# Patient Record
Sex: Female | Born: 1985 | Race: White | Hispanic: No | Smoking: Current every day smoker
Health system: Southern US, Community
[De-identification: ages and names within clinical notes are randomized; demographics above are authoritative.]

## PROBLEM LIST (undated history)

## (undated) ENCOUNTER — Inpatient Hospital Stay: Payer: Self-pay

## (undated) DIAGNOSIS — Q270 Congenital absence and hypoplasia of umbilical artery: Secondary | ICD-10-CM

## (undated) DIAGNOSIS — F32A Depression, unspecified: Secondary | ICD-10-CM

## (undated) DIAGNOSIS — O039 Complete or unspecified spontaneous abortion without complication: Secondary | ICD-10-CM

## (undated) DIAGNOSIS — R1900 Intra-abdominal and pelvic swelling, mass and lump, unspecified site: Secondary | ICD-10-CM

## (undated) DIAGNOSIS — K8020 Calculus of gallbladder without cholecystitis without obstruction: Secondary | ICD-10-CM

## (undated) DIAGNOSIS — O368190 Decreased fetal movements, unspecified trimester, not applicable or unspecified: Secondary | ICD-10-CM

## (undated) DIAGNOSIS — F419 Anxiety disorder, unspecified: Secondary | ICD-10-CM

## (undated) DIAGNOSIS — Z87898 Personal history of other specified conditions: Secondary | ICD-10-CM

## (undated) DIAGNOSIS — F329 Major depressive disorder, single episode, unspecified: Secondary | ICD-10-CM

## (undated) DIAGNOSIS — I499 Cardiac arrhythmia, unspecified: Secondary | ICD-10-CM

## (undated) DIAGNOSIS — R011 Cardiac murmur, unspecified: Secondary | ICD-10-CM

## (undated) HISTORY — DX: Congenital absence and hypoplasia of umbilical artery: Q27.0

## (undated) HISTORY — DX: Calculus of gallbladder without cholecystitis without obstruction: K80.20

## (undated) HISTORY — DX: Personal history of other specified conditions: Z87.898

## (undated) HISTORY — PX: COLONOSCOPY: SHX174

## (undated) HISTORY — DX: Decreased fetal movements, unspecified trimester, not applicable or unspecified: O36.8190

## (undated) HISTORY — PX: WISDOM TOOTH EXTRACTION: SHX21

---
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
## 2008-05-25 ENCOUNTER — Emergency Department: Payer: Self-pay | Admitting: Emergency Medicine

## 2008-12-20 ENCOUNTER — Observation Stay: Payer: Self-pay | Admitting: Unknown Physician Specialty

## 2008-12-23 ENCOUNTER — Inpatient Hospital Stay: Payer: Self-pay | Admitting: Obstetrics and Gynecology

## 2013-03-06 ENCOUNTER — Emergency Department: Payer: Self-pay | Admitting: Unknown Physician Specialty

## 2013-03-06 LAB — COMPREHENSIVE METABOLIC PANEL
Albumin: 4.4 g/dL (ref 3.4–5.0)
Anion Gap: 7 (ref 7–16)
BUN: 12 mg/dL (ref 7–18)
Bilirubin,Total: 0.8 mg/dL (ref 0.2–1.0)
Chloride: 106 mmol/L (ref 98–107)
Creatinine: 0.78 mg/dL (ref 0.60–1.30)
EGFR (African American): >60
EGFR (Non-African Amer.): >60
SGOT(AST): 38 U/L — ABNORMAL HIGH (ref 15–37)
SGPT (ALT): 44 U/L (ref 12–78)
Total Protein: 8.3 g/dL — ABNORMAL HIGH (ref 6.4–8.2)

## 2013-03-06 LAB — URINALYSIS, COMPLETE
Bilirubin,UR: NEGATIVE
Blood: NEGATIVE
Glucose,UR: NEGATIVE mg/dL (ref 0–75)
Ph: 5 (ref 4.5–8.0)
RBC,UR: 7 /HPF (ref 0–5)

## 2013-03-06 LAB — CBC
HCT: 46 % (ref 35.0–47.0)
HGB: 15.8 g/dL (ref 12.0–16.0)
MCH: 30.9 pg (ref 26.0–34.0)
MCV: 90 fL (ref 80–100)
RBC: 5.13 10*6/uL (ref 3.80–5.20)
RDW: 12.3 % (ref 11.5–14.5)
WBC: 5 10*3/uL (ref 3.6–11.0)

## 2013-03-08 ENCOUNTER — Emergency Department: Payer: Self-pay | Admitting: Emergency Medicine

## 2013-03-08 LAB — URINALYSIS, COMPLETE
Bilirubin,UR: NEGATIVE
Glucose,UR: NEGATIVE mg/dL (ref 0–75)
Nitrite: NEGATIVE
Ph: 5 (ref 4.5–8.0)
Protein: 30
RBC,UR: 20 /HPF (ref 0–5)
Specific Gravity: 1.029 (ref 1.003–1.030)
Squamous Epithelial: 19

## 2013-06-11 ENCOUNTER — Emergency Department: Payer: Self-pay | Admitting: Emergency Medicine

## 2013-06-11 LAB — BASIC METABOLIC PANEL
BUN: 8 mg/dL (ref 7–18)
Chloride: 107 mmol/L (ref 98–107)
Co2: 28 mmol/L (ref 21–32)
Creatinine: 0.76 mg/dL (ref 0.60–1.30)
EGFR (African American): >60
EGFR (Non-African Amer.): >60
Glucose: 90 mg/dL (ref 65–99)
Osmolality: 279 (ref 275–301)
Potassium: 3.6 mmol/L (ref 3.5–5.1)

## 2013-06-11 LAB — CBC
HCT: 41.8 % (ref 35.0–47.0)
HGB: 14.6 g/dL (ref 12.0–16.0)
MCV: 89 fL (ref 80–100)
Platelet: 214 10*3/uL (ref 150–440)
RBC: 4.72 10*6/uL (ref 3.80–5.20)

## 2013-06-11 LAB — URINALYSIS, COMPLETE
Glucose,UR: NEGATIVE mg/dL (ref 0–75)
Ketone: NEGATIVE
Nitrite: NEGATIVE
Ph: 6 (ref 4.5–8.0)
RBC,UR: 2 /HPF (ref 0–5)
Squamous Epithelial: 8
WBC UR: 30 /HPF (ref 0–5)

## 2013-06-11 LAB — WET PREP, GENITAL

## 2013-06-11 LAB — GC/CHLAMYDIA PROBE AMP

## 2015-07-20 ENCOUNTER — Emergency Department
Admission: EM | Admit: 2015-07-20 | Discharge: 2015-07-20 | Disposition: A | Discharge status: Home / Self Care | Payer: Medicaid Other | Attending: Emergency Medicine | Admitting: Emergency Medicine

## 2015-07-20 ENCOUNTER — Emergency Department: Payer: Medicaid Other

## 2015-07-20 ENCOUNTER — Encounter: Payer: Self-pay | Admitting: *Deleted

## 2015-07-20 DIAGNOSIS — Z3A01 Less than 8 weeks gestation of pregnancy: Secondary | ICD-10-CM | POA: Diagnosis not present

## 2015-07-20 DIAGNOSIS — O200 Threatened abortion: Secondary | ICD-10-CM | POA: Insufficient documentation

## 2015-07-20 DIAGNOSIS — O209 Hemorrhage in early pregnancy, unspecified: Secondary | ICD-10-CM | POA: Diagnosis present

## 2015-07-20 DIAGNOSIS — Z87891 Personal history of nicotine dependence: Secondary | ICD-10-CM | POA: Insufficient documentation

## 2015-07-20 HISTORY — DX: Complete or unspecified spontaneous abortion without complication: O03.9

## 2015-07-20 LAB — URINALYSIS COMPLETE WITH MICROSCOPIC (ARMC ONLY)
BILIRUBIN URINE: NEGATIVE
GLUCOSE, UA: NEGATIVE mg/dL
Ketones, ur: NEGATIVE mg/dL
Leukocytes, UA: NEGATIVE
Nitrite: NEGATIVE
Protein, ur: NEGATIVE mg/dL
SPECIFIC GRAVITY, URINE: 1.014 (ref 1.005–1.030)
pH: 7 (ref 5.0–8.0)

## 2015-07-20 LAB — COMPREHENSIVE METABOLIC PANEL
ALBUMIN: 4.1 g/dL (ref 3.5–5.0)
ALT: 23 U/L (ref 14–54)
AST: 23 U/L (ref 15–41)
Alkaline Phosphatase: 45 U/L (ref 38–126)
Anion gap: 10 (ref 5–15)
BILIRUBIN TOTAL: 0.4 mg/dL (ref 0.3–1.2)
BUN: 13 mg/dL (ref 6–20)
CHLORIDE: 103 mmol/L (ref 101–111)
CO2: 25 mmol/L (ref 22–32)
CREATININE: 0.59 mg/dL (ref 0.44–1.00)
Calcium: 9.1 mg/dL (ref 8.9–10.3)
GFR calc Af Amer: >60 mL/min (ref >60)
GFR calc non Af Amer: >60 mL/min (ref >60)
Glucose, Bld: 77 mg/dL (ref 65–99)
Potassium: 3.6 mmol/L (ref 3.5–5.1)
Sodium: 138 mmol/L (ref 135–145)
TOTAL PROTEIN: 6.8 g/dL (ref 6.5–8.1)

## 2015-07-20 LAB — CBC WITH DIFFERENTIAL/PLATELET
BASOS PCT: 0 %
Basophils Absolute: 0 10*3/uL (ref 0–0.1)
Eosinophils Absolute: 0.2 10*3/uL (ref 0–0.7)
Eosinophils Relative: 3 %
HCT: 39.6 % (ref 35.0–47.0)
Hemoglobin: 13.6 g/dL (ref 12.0–16.0)
LYMPHS ABS: 2.3 10*3/uL (ref 1.0–3.6)
Lymphocytes Relative: 28 %
MCH: 30.7 pg (ref 26.0–34.0)
MCHC: 34.4 g/dL (ref 32.0–36.0)
MCV: 89.3 fL (ref 80.0–100.0)
MONO ABS: 0.6 10*3/uL (ref 0.2–0.9)
Monocytes Relative: 7 %
Neutro Abs: 5.2 10*3/uL (ref 1.4–6.5)
Neutrophils Relative %: 62 %
Platelets: 213 10*3/uL (ref 150–440)
RBC: 4.43 MIL/uL (ref 3.80–5.20)
RDW: 12.4 % (ref 11.5–14.5)
WBC: 8.4 10*3/uL (ref 3.6–11.0)

## 2015-07-20 LAB — HCG, QUANTITATIVE, PREGNANCY: HCG, BETA CHAIN, QUANT, S: 77897 m[IU]/mL — AB (ref <5)

## 2015-07-20 LAB — ANTIBODY SCREEN: ANTIBODY SCREEN: NEGATIVE

## 2015-07-20 LAB — ABO/RH: ABO/RH(D): O NEG

## 2015-07-20 MED ORDER — RHO D IMMUNE GLOBULIN 1500 UNIT/2ML IJ SOSY
300.0000 ug | PREFILLED_SYRINGE | Freq: Once | INTRAMUSCULAR | Status: AC
Start: 2015-07-20 — End: 2015-07-20
  Administered 2015-07-20: 300 ug via INTRAMUSCULAR
  Filled 2015-07-20: qty 2

## 2015-07-20 NOTE — ED Provider Notes (Signed)
[facility] Emergency Department Provider Note     Time seen: ----------------------------------------- 7:29 AM on 07/20/2015 -----------------------------------------    I have reviewed the triage vital signs and the nursing notes.   HISTORY  Chief Complaint Vaginal Bleeding    HPI Katelyn Jefferson is a 29 y.o. female who presents ER for vaginal bleeding and low back cramping. Patient states today the bleeding started, less than a menstrual bleed. She is currently around [redacted] weeks pregnant, has had a miscarriage in the past. This is probably her third pregnancy she thinks, has one child. Does not have any abdominal pain currently   Past Medical History  Diagnosis Date  . Miscarriage     There are no active problems to display for this patient.   Past Surgical History  Procedure Laterality Date  . Cesarean section      Allergies Sulfur  Social History Social History  Substance Use Topics  . Smoking status: Former Games developer  . Smokeless tobacco: None  . Alcohol Use: No    Review of Systems Constitutional: Negative for fever. Eyes: Negative for visual changes. ENT: Negative for sore throat. Cardiovascular: Negative for chest pain. Respiratory: Negative for shortness of breath. Gastrointestinal: Negative for abdominal pain, vomiting and diarrhea. Genitourinary: Negative for dysuria. Positive for vaginal bleeding Musculoskeletal: Positive for low back pain Skin: Negative for rash. Neurological: Negative for headaches, focal weakness or numbness.  10-point ROS otherwise negative.  ____________________________________________   PHYSICAL EXAM:  VITAL SIGNS: ED Triage Vitals  Enc Vitals Group     BP 07/20/15 0724 108/70 mmHg     Pulse Rate 07/20/15 0724 70     Resp 07/20/15 0724 20     Temp 07/20/15 0724 97.9 F (36.6 C)     Temp Source 07/20/15 0724 Oral     SpO2 07/20/15 0724 100 %     Weight 07/20/15 0724 132 lb (59.875 kg)   Height 07/20/15 0724  (1.651 m)     Head Cir --      Peak Flow --      Pain Score 07/20/15 0725 3     Pain Loc --      Pain Edu? --      Excl. in GC? --     Constitutional: Alert and oriented. Well appearing and in no distress. Eyes: Conjunctivae are normal. PERRL. Normal extraocular movements. ENT   Head: Normocephalic and atraumatic.   Nose: No congestion/rhinnorhea.   Mouth/Throat: Mucous membranes are moist.   Neck: No stridor. Cardiovascular: Normal rate, regular rhythm. Normal and symmetric distal pulses are present in all extremities. No murmurs, rubs, or gallops. Respiratory: Normal respiratory effort without tachypnea nor retractions. Breath sounds are clear and equal bilaterally. No wheezes/rales/rhonchi. Gastrointestinal: Soft and nontender. No distention. No abdominal bruits.  Musculoskeletal: Nontender with normal range of motion in all extremities. No joint effusions.  No lower extremity tenderness nor edema. Neurologic:  Normal speech and language. No gross focal neurologic deficits are appreciated. Speech is normal. No gait instability. Skin:  Skin is warm, dry and intact. No rash noted. Psychiatric: Mood and affect are normal. Speech and behavior are normal. Patient exhibits appropriate insight and judgment.  ____________________________________________  ED COURSE:  Pertinent labs & imaging results that were available during my care of the patient were reviewed by me and considered in my medical decision making (see chart for details). Patient will need a transvaginal ultrasound, will check hCG levels ____________________________________________    LABS (pertinent positives/negatives)  Labs Reviewed  HCG, QUANTITATIVE, PREGNANCY - Abnormal; Notable for the following:    hCG, Beta Chain, [place] [address] (*)    All other components within normal limits  URINALYSIS COMPLETEWITH MICROSCOPIC ([facility] ONLY) - Abnormal; Notable for the following:     Color, Urine YELLOW (*)    APPearance CLEAR (*)    Hgb urine dipstick 2+ (*)    Bacteria, UA RARE (*)    Squamous Epithelial / LPF 0-5 (*)    All other components within normal limits  CBC WITH DIFFERENTIAL/PLATELET  COMPREHENSIVE METABOLIC PANEL    RADIOLOGY Images were viewed by me  Pelvic ultrasound IMPRESSION: 1. There is an early IUP with estimated gestational age of [redacted] weeks 6 days. A cardiac rate of 119 beats per min is observed. 2. Tiny 3 x 10 mm subchorionic hemorrhage is suspected. There is a probable right-sided corpus luteum cyst. ____________________________________________  FINAL ASSESSMENT AND PLAN  Threatened miscarriage  Plan: Patient with labs and imaging as dictated above. At this point ultrasound is grossly unremarkable other than a small subchorionic hemorrhage.. She is advised to close follow-up with her OB/GYN doctor.   Emily Filbert, MD   Emily Filbert, MD 07/20/15 [phone]

## 2015-07-20 NOTE — Discharge Instructions (Signed)

## 2015-07-20 NOTE — ED Notes (Signed)
Had pt sign blood consent for the rhogam injection.

## 2015-07-20 NOTE — ED Notes (Signed)
Patient transported to Ultrasound 

## 2015-07-20 NOTE — ED Notes (Signed)
6 wks preg, today developed vag bleeding and back pain

## 2015-07-21 LAB — RHOGAM INJECTION: Unit division: 0

## 2015-07-31 ENCOUNTER — Other Ambulatory Visit: Payer: Self-pay | Admitting: Advanced Practice Midwife

## 2015-07-31 DIAGNOSIS — Z369 Encounter for antenatal screening, unspecified: Secondary | ICD-10-CM

## 2015-08-31 ENCOUNTER — Ambulatory Visit (HOSPITAL_BASED_OUTPATIENT_CLINIC_OR_DEPARTMENT_OTHER)
Admission: RE | Admit: 2015-08-31 | Discharge: 2015-08-31 | Disposition: A | Discharge status: Home / Self Care | Payer: Medicaid Other | Source: Ambulatory Visit | Attending: Obstetrics & Gynecology | Admitting: Obstetrics & Gynecology

## 2015-08-31 ENCOUNTER — Ambulatory Visit
Admission: RE | Admit: 2015-08-31 | Discharge: 2015-08-31 | Disposition: A | Discharge status: Home / Self Care | Payer: Medicaid Other | Source: Ambulatory Visit | Attending: Advanced Practice Midwife | Admitting: Advanced Practice Midwife

## 2015-08-31 VITALS — BP 116/69 | HR 87 | Temp 98.7°F | Resp 18 | Ht <= 58 in | Wt 142.6 lb

## 2015-08-31 DIAGNOSIS — Z87898 Personal history of other specified conditions: Secondary | ICD-10-CM | POA: Insufficient documentation

## 2015-08-31 DIAGNOSIS — Z369 Encounter for antenatal screening, unspecified: Secondary | ICD-10-CM

## 2015-08-31 DIAGNOSIS — Q270 Congenital absence and hypoplasia of umbilical artery: Secondary | ICD-10-CM

## 2015-08-31 HISTORY — DX: Personal history of other specified conditions: Z87.898

## 2015-08-31 HISTORY — DX: Cardiac murmur, unspecified: R01.1

## 2015-08-31 NOTE — Addendum Note (Signed)
Encounter addended by: Katrina Stack on: 08/31/2015  5:35 PM<BR>     Documentation filed: Notes Section, Follow-up Section

## 2015-08-31 NOTE — Progress Notes (Addendum)
Referring Katelyn Jefferson:  [facility] Department Length of Consultation: 40 minutes   Ms. Wassenaar  was referred to [facility] for genetic counseling to review prenatal screening and testing options.  This note summarizes the information we discussed.    First trimester screening, which can include nuchal translucency ultrasound screen and/or first trimester maternal serum marker screening.  The nuchal translucency has approximately an 80% detection rate for Down syndrome and can be positive for other chromosome abnormalities as well as congenital heart defects.  When combined with a maternal serum marker screening, the detection rate is up to 90% for Down syndrome and up to 97% for trisomy 18.     Maternal serum marker screening, a blood test that measures pregnancy proteins, can provide risk assessments for Down syndrome, trisomy 18, and open neural tube defects (spina bifida, anencephaly). Because it does not directly examine the fetus, it cannot positively diagnose or rule out these problems.  Targeted ultrasound uses high frequency sound waves to create an image of the developing fetus.  An ultrasound is often recommended as a routine means of evaluating the pregnancy.  It is also used to screen for fetal anatomy problems (for example, a heart defect) that might be suggestive of a chromosomal or other abnormality.   Should these screening tests indicate an increased concern, then the following diagnostic options would be offered:  The chorionic villus sampling procedure is available for first trimester chromosome analysis.  This involves the withdrawal of a small amount of chorionic villi (tissue from the developing placenta).  Risk of pregnancy loss is estimated to be approximately 1 in 200 to 1 in 100 (0.5 to 1%).  There is approximately a 1% (1 in 100) chance that the CVS chromosome results will be unclear.  Chorionic villi cannot be tested for neural tube defects.      Amniocentesis involves the removal of a small amount of amniotic fluid from the sac surrounding the fetus with the use of a thin needle inserted through the maternal abdomen and uterus.  Ultrasound guidance is used throughout the procedure.  Fetal cells from amniotic fluid are directly evaluated and > 99.5% of chromosome problems and > 98% of open neural tube defects can be detected. This procedure is generally performed after the 15th week of pregnancy.  The main risks to this procedure include complications leading to miscarriage in less than 1 in 200 cases (0.5%).  As another option for information if the pregnancy is suspected to be an an increased chance for certain chromosome conditions, we also reviewed the availability of cell free fetal DNA testing from maternal blood to determine whether or not the baby may have either Down syndrome, trisomy 62, or trisomy 80.  This test utilizes a maternal blood sample and DNA sequencing technology to isolate circulating cell free fetal DNA from maternal plasma.  The fetal DNA can then be analyzed for DNA sequences that are derived from the three most common chromosomes involved in aneuploidy, chromosomes 13, 18, and 21.  If the overall amount of DNA is greater than the expected level for any of these chromosomes, aneuploidy is suspected.  While we do not consider it a replacement for invasive testing and karyotype analysis, a negative result from this testing would be reassuring, though not a guarantee of a normal chromosome complement for the baby.  An abnormal result is certainly suggestive of an abnormal chromosome complement, though we would still recommend CVS or amniocentesis to confirm any  findings from this testing.   Cystic Fibrosis screening was also discussed with the patient. Cystic fibrosis (CF) is one of the most common genetic conditions in persons of Caucasian ancestry.  This condition occurs in approximately 1 in 2,500 Caucasian persons and  results in thickened secretions in the lungs, digestive, and reproductive systems.  For a baby to be at risk for having CF, both of the parents must be carriers for this condition.  Approximately 1 in 62 Caucasian persons is a carrier for CF.  Current carrier testing looks for the most common mutations in the gene for CF and can detect approximately 90% of carriers in the Caucasian population.  This means that the carrier screening can greatly reduce, but cannot eliminate, the chance for an individual to have a child with CF.  If an individual is found to be a carrier for CF, then carrier testing would be available for the partner. As part of [place]'s newborn screening profile, all babies born in the state of [place] will have a two-tier screening process.  Specimens are first tested to determine the concentration of immunoreactive trypsinogen (IRT).  The top 5% of specimens with the highest IRT values then undergo DNA testing using a panel of over 40 common CF mutations.   We obtained a detailed family history and pregnancy history.  The patient reported a significant history of mental health conditions in her family.  This includes herself, her sister, both parents and at least one grandparent.  We reviewed that mental health conditions are known to have strong genetic factors in some families, but that the specific genes involved are not yet known.  Therefore, we encourage her to stay in touch with a mental health Francesco Provencal as needed, particularly in the post partum time with depression may be more common.  She is not currently taking medication for depression and feels that she is going well now. The remainder of the family history was reported to be unremarkable for birth defects, mental retardation, recurrent pregnancy loss or known chromosome abnormalities.  Ms. Sullivant reported that this is the first pregnancy with her current partner.  He has a 50 year old daughter who is in good health.   She has a 32 year old son who is also healthy and had one first trimester miscarriage.  She reported no complications or exposures that would be expected to increase the risk for birth defects in this pregnancy.  She was smoking 1 pack per day prior to the pregnancy, but has stopped completely. She also had concerns about nosebleeds and trouble catching her breath, which Dr. Leone Brand spoke with her about briefly, stressing that some of that is normal in pregnancy but that if symptoms persist or worsen, to contact her OB.  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  After consideration of the options, Ms. Lodico elected to declined first trimester screening and CF carrier testing.  She did not have an ultrasound today but was scheduled to return for an anatomy ultrasound at 18-[redacted] weeks gestation.  Ms. Ozawa was encouraged to call with questions or concerns.  We can be contacted at [phone].   Cherly Anderson, MS, CGC  Grotegut, [place] A, MD

## 2015-10-12 ENCOUNTER — Ambulatory Visit
Admission: RE | Admit: 2015-10-12 | Discharge: 2015-10-12 | Disposition: A | Discharge status: Home / Self Care | Payer: Medicaid Other | Source: Ambulatory Visit | Attending: Obstetrics and Gynecology | Admitting: Obstetrics and Gynecology

## 2015-10-12 ENCOUNTER — Telehealth: Payer: Self-pay

## 2015-10-12 ENCOUNTER — Ambulatory Visit (HOSPITAL_BASED_OUTPATIENT_CLINIC_OR_DEPARTMENT_OTHER)
Admission: RE | Admit: 2015-10-12 | Discharge: 2015-10-12 | Disposition: A | Discharge status: Home / Self Care | Payer: Medicaid Other | Source: Ambulatory Visit | Attending: Obstetrics and Gynecology | Admitting: Obstetrics and Gynecology

## 2015-10-12 DIAGNOSIS — Q270 Congenital absence and hypoplasia of umbilical artery: Secondary | ICD-10-CM

## 2015-10-12 DIAGNOSIS — Z87898 Personal history of other specified conditions: Secondary | ICD-10-CM

## 2015-10-12 HISTORY — DX: Congenital absence and hypoplasia of umbilical artery: Q27.0

## 2015-10-12 NOTE — Progress Notes (Signed)
Katelyn Wells, MS, CGC performed an integral service incident to the physician's initial service.  I was physically present in the clinical area and was immediately available to render assistance.   [name] [name]  

## 2015-10-12 NOTE — Addendum Note (Signed)
Encounter addended by: Katrina Stackeborah Cassandra Harbold on: 10/12/2015  5:06 PM<BR>     Documentation filed: Clinical Notes, Orders

## 2015-10-12 NOTE — Telephone Encounter (Signed)
Patient notified of fetal echo appointment scheduled for 11-07-15 @ 1100 with [facility] in the [place] office.

## 2015-10-12 NOTE — Progress Notes (Addendum)
Department, [place] Length of Consultation: 20 minutes  Ms. Lutter was seen again today for brief genetic counseling following an ultrasound for fetal anatomy at the [facility].   A thorough evaluation of the fetal anatomy was performed, and a single umbilical artery, also known as a 2 vessel cord, was noted.  Within the limits of the procedure, all other fetal anatomy appeared normal.  This is a summary of our discussion.  As we discussed, the umbilical cord typically contains two arteries, which return waste materials and oxygen-poor blood to the mother from the baby. There is also one vein which brings oxygen rich blood to the baby.  In at least 1% of pregnancies, there is only one umbilical artery present.  The majority of these newborns are normal and healthy and have no other complications.  When a single umbilical artery is seen on ultrasound, the chance for other associated birth differences is known to be increased. Approximately 20% of infants with a single umbilical artery have some other physical or structural abnormalities, although this number varies in the literature. The remainder have no apparent abnormalities.  In the absence of other birth differences, we know that the chance for low birth weight, fetal growth restriction, and premature delivery may still be a concern.     If no other abnormalities are seen, the prognosis with a single umbilical artery is usually good.  We discussed that the rest of the ultrasound was within normal limits and no other physical abnormalities were noted.  We discussed that a follow-up ultrasound in the third trimester should be performed to check the growth of the baby.   A fetal echocardiogram was also recommended to assess for fetal cardiac differences.  We discussed that there is a slight association between single umbilical arteries and chromosome abnormalities. Presence of a single umbilical artery somewhat  increases the risk for a fetal chromosome condition (compared to baseline maternal age or screen related risk for a chromosome condition before the single umbilical artery was found).  The increase in risk for a chromosome problem is more significant if there are other ultrasound markers or abnormalities, and less significant if single umbilical artery is the only atypical ultrasound finding. Chromosomes are the inherited structures that contain our genes (traits).  Each cell of our body normally has 46 chromosomes, matched up in 23 pairs.  The last pair determines our gender and are called the sex chromosomes.  A female has an X and a Y chromosome, while a female has two X chromosomes.  Rarely, when a mother's egg or father's sperm unite, an extra or missing chromosome can be passed on to the baby by mistake.  We discussed examples of such a problem including: Down syndrome (an extra 47) and Edward syndrome (an extra 18), both involving some degree of mental retardation and physical problems.  We also discussed that because of the possibility of other abnormalities, it is sometimes recommended that newborns with a single umbilical artery be thoroughly evaluated.      We discussed the following prenatal testing options for this pregnancy:  Maternal serum marker screening, a blood test that measures pregnancy proteins, can provide risk assessments for Down syndrome, trisomy 18, and open neural tube defects (spina bifida, anencephaly). Because it does not directly examine the fetus, it cannot positively diagnose or rule out these problems.  Amniocentesis involves the removal of a small amount of amniotic fluid from the sac  surrounding the fetus ("bag of water") with the use of a thin needle inserted through the mother's abdomen and uterus.  Ultrasound guidance is used throughout the procedure.  Fetal cells are directly evaluated and >99.5% of chromosome problems and >98% of neural tube defects can be detected.   The main risks to this procedure are complications leading to miscarriage in less than 1 in 200 cases (0.5%).  We also reviewed the availability of cell free fetal DNA testing from maternal blood to determine whether or not the baby may have either Down syndrome, trisomy 5413, or trisomy 4318.  This test utilizes a maternal blood sample and DNA sequencing technology to isolate circulating cell free fetal DNA from maternal plasma.  The fetal DNA can then be analyzed for DNA sequences that are derived from the three most common chromosomes involved in aneuploidy, chromosomes 13, 18, and 21.  If the overall amount of DNA is greater than the expected level for any of these chromosomes, aneuploidy is suspected. While we do not consider it a replacement for invasive testing and karyotype analysis, a negative result from this testing would be reassuring, though not a guarantee of a normal chromosome complement for the baby.  An abnormal result is certainly suggestive of an abnormal chromosome complement, though we would still recommend CVS or amniocentesis to confirm any findings from this testing.  Ultrasound can sometimes detect other fetal anatomy problems (e.g. a heart defect) that suggest a chromosome problem; however, this is often not the case.  An additional ultrasound may be recommended whether or not you choose to have one or both of the above tests to continue to evaluate the fetal anatomy and growth as a precautionary measure.  Again, we recommended another ultrasound in the third trimester.    Fetal echocardiography is a special ultrasound to examine to fetal heart.  This test can be done after [redacted] weeks gestation to look for heart defects.  Because babies with Down syndrome have about a 50% chance of having a heart defect, we do offer this to provide you with further information.  There is no risk to the pregnancy from this procedure.  The family history and pregnancy history were documented  previously.  After consideration of your options, the patient elected to decline any additional testing at this time other than the echocardiogram and growth ultrasound.  Thank you for involving us in the care of this patient.  If any further questions or concerns arise, please do not hesitate to call us at [phone].   Addendum:  The patient called back to the clinic at 12:15pm and stated that she would like to return this afternoon at 4pm for InformaSeq testing due to the ultrasound findings today.  Addendum 2:  At 4:40pm I called the patient to inquire about her lab studies and she stated that she had changed her mind and does not desire InformaSeq testing today.  Cherly Andersoneborah F. Latoyna Hird, MS, CGC

## 2015-10-27 ENCOUNTER — Observation Stay
Admission: EM | Admit: 2015-10-27 | Discharge: 2015-10-27 | Disposition: A | Discharge status: Home / Self Care | Payer: Medicaid Other | Attending: Obstetrics and Gynecology | Admitting: Obstetrics and Gynecology

## 2015-10-27 ENCOUNTER — Encounter: Payer: Self-pay | Admitting: *Deleted

## 2015-10-27 DIAGNOSIS — O368190 Decreased fetal movements, unspecified trimester, not applicable or unspecified: Secondary | ICD-10-CM | POA: Diagnosis present

## 2015-10-27 DIAGNOSIS — O368120 Decreased fetal movements, second trimester, not applicable or unspecified: Secondary | ICD-10-CM | POA: Diagnosis not present

## 2015-10-27 DIAGNOSIS — Z87891 Personal history of nicotine dependence: Secondary | ICD-10-CM | POA: Insufficient documentation

## 2015-10-27 DIAGNOSIS — Z3A21 21 weeks gestation of pregnancy: Secondary | ICD-10-CM | POA: Insufficient documentation

## 2015-10-27 DIAGNOSIS — Q270 Congenital absence and hypoplasia of umbilical artery: Secondary | ICD-10-CM

## 2015-10-27 HISTORY — DX: Decreased fetal movements, unspecified trimester, not applicable or unspecified: O36.8190

## 2015-10-27 NOTE — Plan of Care (Signed)
Discharge instructions given and explained. Verbalized understanding.  Signed copy in hand and one in chart.

## 2015-10-27 NOTE — OB Triage Note (Signed)
Recvd with complaints of decreased fetal movement at 21 weeks. Changed to gown, plan of care discussed.  Verbalized understanding.  Agrees with plan of care.

## 2015-10-28 NOTE — Discharge Summary (Signed)
OB ATTENDING  LMP: 06/03/15 EDD; 02/09/16  29yo [id number] @ 21+1 here for decreased FM. Now feeling FM. No other complaints.   APC: [facility] - h/o c/s - single umbilical artery - Fetal ECHO for 11/07/15   OB History    Gravida Para Term Preterm AB TAB SAB Ectopic Multiple Living   3 1  1 1  1   1      Past Medical History  Diagnosis Date  . Miscarriage   . Heart murmur     Congential heart murmur   Past Surgical History  Procedure Laterality Date  . Cesarean section    . Colonoscopy  29 years old    Allergies  Allergen Reactions  . Sulfur Hives   Social History   Social History  . Marital Status: Divorced    Spouse Name: N/A  . Number of Children: N/A  . Years of Education: N/A   Occupational History  . Not on file.   Social History Main Topics  . Smoking status: Former Games developermoker  . Smokeless tobacco: Never Used  . Alcohol Use: No  . Drug Use: No  . Sexual Activity: Yes   Other Topics Concern  . Not on file   Social History Narrative   O: There were no vitals filed for this visit.  TAS: performed by RN Tammy - active fetal movement  A/P: 29yo [id number] @ 21+1 here for decreased FM. Now feeling FM.  - f/u with [facility]  Ala DachJohanna K Halfon, MD

## 2015-11-06 NOTE — Addendum Note (Signed)
Encounter addended by: Italyhad Grotegut, MD on: 11/06/2015  4:32 PM<BR>     Documentation filed: Charges VN, Notes Section, Visit Diagnoses

## 2015-12-14 ENCOUNTER — Ambulatory Visit
Admission: RE | Admit: 2015-12-14 | Discharge: 2015-12-14 | Disposition: A | Discharge status: Home / Self Care | Payer: Medicaid Other | Source: Ambulatory Visit | Attending: Maternal & Fetal Medicine | Admitting: Maternal & Fetal Medicine

## 2015-12-14 VITALS — BP 129/73 | HR 91 | Temp 97.5°F | Resp 18 | Ht 64.8 in | Wt 166.8 lb

## 2015-12-14 DIAGNOSIS — Z3A27 27 weeks gestation of pregnancy: Secondary | ICD-10-CM | POA: Diagnosis not present

## 2015-12-14 DIAGNOSIS — Q270 Congenital absence and hypoplasia of umbilical artery: Secondary | ICD-10-CM

## 2016-01-10 ENCOUNTER — Other Ambulatory Visit: Payer: Self-pay

## 2016-01-10 DIAGNOSIS — IMO0001 Reserved for inherently not codable concepts without codable children: Secondary | ICD-10-CM

## 2016-01-11 ENCOUNTER — Ambulatory Visit
Admission: RE | Admit: 2016-01-11 | Discharge: 2016-01-11 | Disposition: A | Discharge status: Home / Self Care | Payer: Medicaid Other | Source: Ambulatory Visit | Attending: Maternal & Fetal Medicine | Admitting: Maternal & Fetal Medicine

## 2016-01-11 VITALS — BP 127/74 | HR 87 | Temp 97.9°F | Resp 18 | Wt 173.8 lb

## 2016-01-11 DIAGNOSIS — Z36 Encounter for antenatal screening of mother: Secondary | ICD-10-CM | POA: Diagnosis not present

## 2016-01-11 DIAGNOSIS — Q270 Congenital absence and hypoplasia of umbilical artery: Secondary | ICD-10-CM

## 2016-01-11 DIAGNOSIS — Z3A31 31 weeks gestation of pregnancy: Secondary | ICD-10-CM | POA: Diagnosis not present

## 2016-01-11 DIAGNOSIS — IMO0001 Reserved for inherently not codable concepts without codable children: Secondary | ICD-10-CM

## 2016-01-25 ENCOUNTER — Encounter: Payer: Self-pay | Admitting: *Deleted

## 2016-01-25 ENCOUNTER — Observation Stay
Admission: EM | Admit: 2016-01-25 | Discharge: 2016-01-25 | Disposition: A | Discharge status: Home / Self Care | Payer: Medicaid Other | Attending: Obstetrics and Gynecology | Admitting: Obstetrics and Gynecology

## 2016-01-25 DIAGNOSIS — Z915 Personal history of self-harm: Secondary | ICD-10-CM | POA: Insufficient documentation

## 2016-01-25 DIAGNOSIS — Z3A33 33 weeks gestation of pregnancy: Secondary | ICD-10-CM | POA: Diagnosis not present

## 2016-01-25 DIAGNOSIS — Q270 Congenital absence and hypoplasia of umbilical artery: Secondary | ICD-10-CM

## 2016-01-25 DIAGNOSIS — O368130 Decreased fetal movements, third trimester, not applicable or unspecified: Principal | ICD-10-CM | POA: Insufficient documentation

## 2016-01-25 DIAGNOSIS — O34219 Maternal care for unspecified type scar from previous cesarean delivery: Secondary | ICD-10-CM | POA: Insufficient documentation

## 2016-01-25 DIAGNOSIS — F329 Major depressive disorder, single episode, unspecified: Secondary | ICD-10-CM | POA: Insufficient documentation

## 2016-01-25 DIAGNOSIS — O99343 Other mental disorders complicating pregnancy, third trimester: Secondary | ICD-10-CM | POA: Insufficient documentation

## 2016-01-25 LAB — URINALYSIS COMPLETE WITH MICROSCOPIC (ARMC ONLY)
Bilirubin Urine: NEGATIVE
Glucose, UA: 150 mg/dL — AB
HGB URINE DIPSTICK: NEGATIVE
Ketones, ur: NEGATIVE mg/dL
Nitrite: NEGATIVE
PH: 6 (ref 5.0–8.0)
PROTEIN: NEGATIVE mg/dL
SPECIFIC GRAVITY, URINE: 1.021 (ref 1.005–1.030)

## 2016-01-25 LAB — FETAL FIBRONECTIN: FETAL FIBRONECTIN: NEGATIVE

## 2016-01-25 MED ORDER — TERBUTALINE SULFATE 1 MG/ML IJ SOLN
0.2500 mg | Freq: Once | INTRAMUSCULAR | Status: AC
  Administered 2016-01-25: 0.25 mg via SUBCUTANEOUS
  Filled 2016-01-25: qty 1

## 2016-01-25 NOTE — Progress Notes (Signed)
Patient ID: Katelyn Jefferson, female   DOB: 08/01/1986, 30 y.o.   MRN: [id number] Azile L Jakes 1986/06/25 G3 P1 [redacted]w[redacted]d presents for decreased fetal movt seen in [place] earlier .  No LOF , no vaginal bleeding , PMHX :  Depression , self harmer  H/o anorexia  PSHx ; c/s Ros unremarkable  Med sPNV  Problems this preg : 2 v cord , nl fetal echo  O;LMP 06/03/2015 [facility] NT  CX cx closed / 60% / -2 vtx NST Reactive NST, good variability , no decels . + ctx whiched decreased with 1 sq terb   Labs: ua: neg, FFN : neg  A: decreased fetal movt  Preterm CTX , no cervical dilation  P:D/c home  Cont daily Fetal kick count  RTC if > 5 ctx / hr or abdominal pain

## 2016-01-25 NOTE — Progress Notes (Signed)
Reviewed discharge instructions with patient, including signs of preterm labor, rupture of membranes, adequate fetal kick counts and excessive vaginal bleeding. Patient verbalized understanding. Copy of instructions given to patient. Stable and ambulatory upon discharge.

## 2016-01-25 NOTE — OB Triage Note (Signed)
Noticed decrease in fetal movement around 3 AM this morning. Baby is not "rolling" like normal. Increased vaginal discharge starting this AM. No vaginal bleeding.

## 2016-01-25 NOTE — Discharge Summary (Signed)
  Katelyn Jefferson 08-23-1986 G3 P1 [redacted]w[redacted]d presents for decreased fetal movt seen in [place] earlier .  No LOF , no vaginal bleeding , PMHX : Depression , self harmer H/o anorexia  PSHx ; c/s Ros unremarkable  Med sPNV  Problems this preg : 2 v cord , nl fetal echo  O;LMP 06/03/2015 [facility] NT  CX cx closed / 60% / -2 vtx NST Reactive NST, good variability , no decels . + ctx whiched decreased with 1 sq terb  Labs: ua: neg, FFN : neg  A: decreased fetal movt  Preterm CTX , no cervical dilation  P:D/c home  Cont daily Fetal kick count  RTC if > 5 ctx / hr or abdominal pain

## 2016-02-28 ENCOUNTER — Encounter
Admission: EM | Disposition: A | Discharge status: Home / Self Care | Payer: Self-pay | Source: Home / Self Care | Attending: Obstetrics and Gynecology

## 2016-02-28 ENCOUNTER — Inpatient Hospital Stay: Payer: Medicaid Other | Admitting: Anesthesiology

## 2016-02-28 ENCOUNTER — Encounter: Payer: Self-pay | Admitting: *Deleted

## 2016-02-28 ENCOUNTER — Inpatient Hospital Stay
Admission: EM | Admit: 2016-02-28 | Discharge: 2016-03-02 | DRG: 765 | Disposition: A | Discharge status: Home / Self Care | Payer: Medicaid Other | Attending: Obstetrics and Gynecology | Admitting: Obstetrics and Gynecology

## 2016-02-28 DIAGNOSIS — O34211 Maternal care for low transverse scar from previous cesarean delivery: Secondary | ICD-10-CM | POA: Diagnosis present

## 2016-02-28 DIAGNOSIS — Z302 Encounter for sterilization: Secondary | ICD-10-CM | POA: Diagnosis not present

## 2016-02-28 DIAGNOSIS — Z9889 Other specified postprocedural states: Secondary | ICD-10-CM

## 2016-02-28 DIAGNOSIS — D62 Acute posthemorrhagic anemia: Secondary | ICD-10-CM | POA: Diagnosis not present

## 2016-02-28 DIAGNOSIS — Z3A38 38 weeks gestation of pregnancy: Secondary | ICD-10-CM

## 2016-02-28 DIAGNOSIS — Z87891 Personal history of nicotine dependence: Secondary | ICD-10-CM

## 2016-02-28 DIAGNOSIS — O9081 Anemia of the puerperium: Secondary | ICD-10-CM | POA: Diagnosis not present

## 2016-02-28 HISTORY — PX: TUBAL LIGATION: SHX77

## 2016-02-28 LAB — TYPE AND SCREEN
ABO/RH(D): O NEG
ANTIBODY SCREEN: NEGATIVE

## 2016-02-28 LAB — CBC
HCT: 37.1 % (ref 35.0–47.0)
HEMOGLOBIN: 12.6 g/dL (ref 12.0–16.0)
MCH: 30.4 pg (ref 26.0–34.0)
MCHC: 34.1 g/dL (ref 32.0–36.0)
MCV: 89 fL (ref 80.0–100.0)
Platelets: 206 10*3/uL (ref 150–440)
RBC: 4.16 MIL/uL (ref 3.80–5.20)
RDW: 13.9 % (ref 11.5–14.5)
WBC: 12.6 10*3/uL — ABNORMAL HIGH (ref 3.6–11.0)

## 2016-02-28 SURGERY — Surgical Case
Anesthesia: Spinal | Site: Abdomen | Wound class: Clean Contaminated

## 2016-02-28 MED ORDER — ACETAMINOPHEN 325 MG PO TABS
650.0000 mg | ORAL_TABLET | ORAL | Status: DC | PRN
  Filled 2016-02-28: qty 2

## 2016-02-28 MED ORDER — SODIUM CHLORIDE 0.9% FLUSH: 3.0000 mL | INTRAVENOUS | Status: DC | PRN

## 2016-02-28 MED ORDER — TETANUS-DIPHTH-ACELL PERTUSSIS 5-2.5-18.5 LF-MCG/0.5 IM SUSP: 0.5000 mL | Freq: Once | INTRAMUSCULAR | Status: DC

## 2016-02-28 MED ORDER — DIBUCAINE 1 % RE OINT: 1.0000 "application " | TOPICAL_OINTMENT | RECTAL | Status: DC | PRN

## 2016-02-28 MED ORDER — ZOLPIDEM TARTRATE 5 MG PO TABS: 5.0000 mg | ORAL_TABLET | Freq: Every evening | ORAL | Status: DC | PRN

## 2016-02-28 MED ORDER — SIMETHICONE 80 MG PO CHEW
80.0000 mg | CHEWABLE_TABLET | ORAL | Status: DC | PRN
  Administered 2016-02-29 – 2016-03-01 (×2): 80 mg via ORAL
  Filled 2016-02-28: qty 1

## 2016-02-28 MED ORDER — SIMETHICONE 80 MG PO CHEW: 80.0000 mg | CHEWABLE_TABLET | ORAL | Status: DC

## 2016-02-28 MED ORDER — 0.9 % SODIUM CHLORIDE (POUR BTL) OPTIME
TOPICAL | Status: DC | PRN
  Administered 2016-02-28: 1000 mL

## 2016-02-28 MED ORDER — FENTANYL CITRATE (PF) 100 MCG/2ML IJ SOLN: 25.0000 ug | INTRAMUSCULAR | Status: DC | PRN

## 2016-02-28 MED ORDER — TERBUTALINE SULFATE 1 MG/ML IJ SOLN
INTRAMUSCULAR | Status: AC
  Administered 2016-02-28: 0.25 mg
  Filled 2016-02-28: qty 1

## 2016-02-28 MED ORDER — LANOLIN HYDROUS EX OINT: 1.0000 "application " | TOPICAL_OINTMENT | CUTANEOUS | Status: DC | PRN

## 2016-02-28 MED ORDER — PHENYLEPHRINE HCL 10 MG/ML IJ SOLN
INTRAMUSCULAR | Status: DC | PRN
  Administered 2016-02-28 (×2): 100 ug via INTRAVENOUS

## 2016-02-28 MED ORDER — BUPIVACAINE IN DEXTROSE 0.75-8.25 % IT SOLN
INTRATHECAL | Status: DC | PRN
  Administered 2016-02-28: 1.6 mL via INTRATHECAL

## 2016-02-28 MED ORDER — OXYCODONE HCL 5 MG PO TABS
10.0000 mg | ORAL_TABLET | ORAL | Status: DC | PRN
  Administered 2016-02-28 – 2016-03-02 (×11): 10 mg via ORAL
  Filled 2016-02-28 (×11): qty 2

## 2016-02-28 MED ORDER — CEFAZOLIN SODIUM-DEXTROSE 2-3 GM-% IV SOLR
INTRAVENOUS | Status: AC
  Administered 2016-02-28: 2 g via INTRAVENOUS
  Filled 2016-02-28: qty 50

## 2016-02-28 MED ORDER — MENTHOL 3 MG MT LOZG
1.0000 | LOZENGE | OROMUCOSAL | Status: DC | PRN
  Filled 2016-02-28: qty 9

## 2016-02-28 MED ORDER — IBUPROFEN 600 MG PO TABS
600.0000 mg | ORAL_TABLET | Freq: Four times a day (QID) | ORAL | Status: DC
  Administered 2016-02-28 – 2016-03-02 (×9): 600 mg via ORAL
  Filled 2016-02-28 (×10): qty 1

## 2016-02-28 MED ORDER — OXYTOCIN 10 UNIT/ML IJ SOLN
2.5000 [IU]/h | INTRAMUSCULAR | Status: AC
  Filled 2016-02-28: qty 10

## 2016-02-28 MED ORDER — OXYCODONE HCL 5 MG PO TABS: 5.0000 mg | ORAL_TABLET | Freq: Once | ORAL | Status: DC | PRN

## 2016-02-28 MED ORDER — LACTATED RINGERS IV SOLN: INTRAVENOUS | Status: DC

## 2016-02-28 MED ORDER — PRENATAL MULTIVITAMIN CH
1.0000 | ORAL_TABLET | Freq: Every day | ORAL | Status: DC
  Administered 2016-02-29: 1 via ORAL
  Filled 2016-02-28 (×2): qty 1

## 2016-02-28 MED ORDER — FENTANYL CITRATE (PF) 100 MCG/2ML IJ SOLN
INTRAMUSCULAR | Status: DC | PRN
  Administered 2016-02-28: 15 ug via INTRATHECAL

## 2016-02-28 MED ORDER — DIPHENHYDRAMINE HCL 25 MG PO CAPS: 25.0000 mg | ORAL_CAPSULE | ORAL | Status: DC | PRN

## 2016-02-28 MED ORDER — ONDANSETRON HCL 4 MG/2ML IJ SOLN: 4.0000 mg | Freq: Once | INTRAMUSCULAR | Status: DC | PRN

## 2016-02-28 MED ORDER — KETOROLAC TROMETHAMINE 30 MG/ML IJ SOLN
30.0000 mg | Freq: Four times a day (QID) | INTRAMUSCULAR | Status: AC | PRN
  Administered 2016-02-28 (×2): 30 mg via INTRAVENOUS
  Filled 2016-02-28 (×2): qty 1

## 2016-02-28 MED ORDER — DIPHENHYDRAMINE HCL 50 MG/ML IJ SOLN
12.5000 mg | INTRAMUSCULAR | Status: DC | PRN
Start: 2016-02-28 — End: 2016-03-02

## 2016-02-28 MED ORDER — LACTATED RINGERS IV SOLN
INTRAVENOUS | Status: DC | PRN
  Administered 2016-02-28: 04:00:00 via INTRAVENOUS

## 2016-02-28 MED ORDER — SIMETHICONE 80 MG PO CHEW
80.0000 mg | CHEWABLE_TABLET | Freq: Three times a day (TID) | ORAL | Status: DC
Start: 2016-02-28 — End: 2016-03-02
  Administered 2016-02-29 – 2016-03-01 (×6): 80 mg via ORAL
  Filled 2016-02-28 (×8): qty 1

## 2016-02-28 MED ORDER — NALBUPHINE HCL 10 MG/ML IJ SOLN: 5.0000 mg | Freq: Once | INTRAMUSCULAR | Status: DC | PRN

## 2016-02-28 MED ORDER — CITRIC ACID-SODIUM CITRATE 334-500 MG/5ML PO SOLN
30.0000 mL | ORAL | Status: AC
  Administered 2016-02-28: 30 mL via ORAL

## 2016-02-28 MED ORDER — KETOROLAC TROMETHAMINE 30 MG/ML IJ SOLN: 30.0000 mg | Freq: Four times a day (QID) | INTRAMUSCULAR | Status: AC | PRN

## 2016-02-28 MED ORDER — TERBUTALINE SULFATE 1 MG/ML IJ SOLN
0.2500 mg | Freq: Once | INTRAMUSCULAR | Status: DC
  Filled 2016-02-28: qty 1

## 2016-02-28 MED ORDER — NALOXONE HCL 2 MG/2ML IJ SOSY
1.0000 ug/kg/h | PREFILLED_SYRINGE | INTRAVENOUS | Status: DC | PRN
  Filled 2016-02-28: qty 2

## 2016-02-28 MED ORDER — OXYTOCIN 40 UNITS IN LACTATED RINGERS INFUSION - SIMPLE MED
INTRAVENOUS | Status: DC | PRN
  Administered 2016-02-28: 199 mL via INTRAVENOUS
  Administered 2016-02-28: 1 mL via INTRAVENOUS

## 2016-02-28 MED ORDER — NALBUPHINE HCL 10 MG/ML IJ SOLN: 5.0000 mg | INTRAMUSCULAR | Status: DC | PRN

## 2016-02-28 MED ORDER — ONDANSETRON HCL 4 MG/2ML IJ SOLN
4.0000 mg | Freq: Three times a day (TID) | INTRAMUSCULAR | Status: DC | PRN
  Administered 2016-02-28: 4 mg via INTRAVENOUS

## 2016-02-28 MED ORDER — WITCH HAZEL-GLYCERIN EX PADS: 1.0000 "application " | MEDICATED_PAD | CUTANEOUS | Status: DC | PRN

## 2016-02-28 MED ORDER — OXYCODONE HCL 5 MG/5ML PO SOLN: 5.0000 mg | Freq: Once | ORAL | Status: DC | PRN

## 2016-02-28 MED ORDER — MORPHINE SULFATE (PF) 0.5 MG/ML IJ SOLN
INTRAMUSCULAR | Status: DC | PRN
  Administered 2016-02-28: .1 mg via INTRATHECAL

## 2016-02-28 MED ORDER — MEPERIDINE HCL 25 MG/ML IJ SOLN: 6.2500 mg | INTRAMUSCULAR | Status: DC | PRN

## 2016-02-28 MED ORDER — NALOXONE HCL 0.4 MG/ML IJ SOLN
0.4000 mg | INTRAMUSCULAR | Status: DC | PRN
  Filled 2016-02-28: qty 1

## 2016-02-28 MED ORDER — SENNOSIDES-DOCUSATE SODIUM 8.6-50 MG PO TABS: 2.0000 | ORAL_TABLET | ORAL | Status: DC

## 2016-02-28 MED ORDER — MIDAZOLAM HCL 5 MG/5ML IJ SOLN
INTRAMUSCULAR | Status: DC | PRN
  Administered 2016-02-28 (×2): 1 mg via INTRAVENOUS

## 2016-02-28 SURGICAL SUPPLY — 23 items
BARRIER ADHS 3X4 INTERCEED (GAUZE/BANDAGES/DRESSINGS) ×4 IMPLANT
CANISTER SUCT 3000ML (MISCELLANEOUS) ×4 IMPLANT
CATH KIT ON-Q SILVERSOAK 5IN (CATHETERS) IMPLANT
CHLORAPREP W/TINT 26ML (MISCELLANEOUS) ×8 IMPLANT
DRSG TELFA 3X8 NADH (GAUZE/BANDAGES/DRESSINGS) ×4 IMPLANT
ELECT CAUTERY BLADE 6.4 (BLADE) ×4 IMPLANT
ELECT REM PT RETURN 9FT ADLT (ELECTROSURGICAL) ×4
ELECTRODE REM PT RTRN 9FT ADLT (ELECTROSURGICAL) ×2 IMPLANT
GAUZE SPONGE 4X4 12PLY STRL (GAUZE/BANDAGES/DRESSINGS) ×4 IMPLANT
GLOVE BIO SURGEON STRL SZ8 (GLOVE) ×24 IMPLANT
GOWN STRL REUS W/ TWL LRG LVL3 (GOWN DISPOSABLE) ×4 IMPLANT
GOWN STRL REUS W/ TWL XL LVL3 (GOWN DISPOSABLE) ×2 IMPLANT
GOWN STRL REUS W/TWL LRG LVL3 (GOWN DISPOSABLE) ×4
GOWN STRL REUS W/TWL XL LVL3 (GOWN DISPOSABLE) ×2
NS IRRIG 1000ML POUR BTL (IV SOLUTION) ×4 IMPLANT
PACK C SECTION AR (MISCELLANEOUS) ×4 IMPLANT
PAD OB MATERNITY 4.3X12.25 (PERSONAL CARE ITEMS) ×4 IMPLANT
PAD PREP 24X41 OB/GYN DISP (PERSONAL CARE ITEMS) ×4 IMPLANT
STAPLER INSORB 30 2030 C-SECTI (MISCELLANEOUS) ×4 IMPLANT
STRAP SAFETY BODY (MISCELLANEOUS) ×4 IMPLANT
SUT CHROMIC 1 CTX 36 (SUTURE) ×12 IMPLANT
SUT PLAIN GUT 0 (SUTURE) ×12 IMPLANT
SUT VIC AB 0 CT1 36 (SUTURE) ×8 IMPLANT

## 2016-02-28 NOTE — H&P (Signed)
Katelyn Jefferson is a 30 y.o. female At 38+4 weeks presenting for SROM and labor with a h/o prior cesarean section . Pt has decided on elective permanent sterilization ( reconfirmed)  This pregnancy complicated by : Fetal vetriculomegaly- last u/s 3/1 by MFM stated that it was still not evident . Peds conference requested Fetal Karyotype + microarray 2V cord- 5/13 41% growth on 02/07/16 u/s  RH neg. Pt with remote h/o depression and suicide ideation- no meds History OB History    Gravida Para Term Preterm AB TAB SAB Ectopic Multiple Living   3 1  1 1  1   1      Past Medical History  Diagnosis Date  . Miscarriage   . Heart murmur     Congential heart murmur   Past Surgical History  Procedure Laterality Date  . Cesarean section    . Colonoscopy  30 years old   Family History: family history is not on file. Social History:  reports that she has quit smoking. She has never used smokeless tobacco. She reports that she does not drink alcohol or use illicit drugs.   Prenatal Transfer Tool  Maternal Diabetes: No Genetic Screening: Declined Maternal Ultrasounds/Referrals: Normal fetal echo negative  Fetal Ultrasounds or other Referrals:  Fetal echo, Referred to Materal Fetal Medicine initial ventriculomegaly Maternal Substance Abuse:  No Significant Maternal Medications:  None Significant Maternal Lab Results:  None Other Comments:  None  ROS  Dilation: 3 Effacement (%): 100 Station: -1 Exam by:: Katelyn Jefferson Blood pressure 119/90, pulse 84, temperature 98.2 F (36.8 C), temperature source Oral, resp. rate 16, height 5\' 5"  (1.651 m), weight 188 lb (85.276 kg), last menstrual period 06/03/2015. Exam Physical Exam  CV RRR  Lungs cta  CX 3/c/-1/vtx  NST 140 + accels , no decels , reg CTX q2 min  Prenatal labs: ABO, Rh: --/--/O NEG (08/11 0739) Antibody: NEG (08/11 0739) Rubella:  immune RPR:   NR HBsAg:   neg HIV:   Neg GBS:   Neg   Assessment/Plan: 38+4 weeks with prior  c/s . Active labor . Prior fetal ventriculomegaly on previous exams , but not evident on last MFM u/s 02/07/16.  Pt elects for BTL  Current reassuring fetal monitoring  Repeat LTCS + BTL  Pt is counseled for risks of the procedure and all questions answered .    Katelyn Jefferson 02/28/2016, 2:55 AM

## 2016-02-28 NOTE — Transfer of Care (Signed)
Immediate Anesthesia Transfer of Care Note  Patient: Katelyn Jefferson  Procedure(s) Performed: Procedure(s): CESAREAN SECTION (N/A) BILATERAL TUBAL LIGATION  Patient Location: PACU  Anesthesia Type:Spinal  Level of Consciousness: awake, alert , oriented and patient cooperative  Airway & Oxygen Therapy: Patient Spontanous Breathing  Post-op Assessment: Report given to RN and Post -op Vital signs reviewed and stable  Post vital signs: Reviewed and stable  Last Vitals:  Filed Vitals:   02/28/16 0129  BP: 119/90  Pulse: 84  Temp: 36.8 C  Resp: 16    Complications: No apparent anesthesia complications

## 2016-02-28 NOTE — Brief Op Note (Signed)
02/28/2016  4:22 AM  PATIENT:  Collins Marijo ConceptionL Gretzinger  30 y.o. female  PRE-OPERATIVE DIAGNOSIS:  prior cesarean, elective sterilization  2V cord , prior h/o fetal ventriculomegaly  POST-OPERATIVE DIAGNOSIS:  Same, vigorous female APGARS 9/10 , wt 3640 gm  Knot in umbilical cord PROCEDURE:  Procedure(s): CESAREAN SECTION (N/A) BILATERAL TUBAL LIGATION  Cord blood for karyotype and microarray   SURGEON:  Surgeon(s) and Role:    Suzy Bouchard* Leoma Folds J Tianni Escamilla, MD - Primary  PHYSICIAN ASSISTANT:   ASSISTANTS: Street, scrub tech    ANESTHESIA:   spinal  EBL:  Total I/O In: 600 [I.V.:600] Out: - EBL 400 cc  BLOOD ADMINISTERED:none  DRAINS: Urinary Catheter (Foley)   LOCAL MEDICATIONS USED:  NONE  SPECIMEN:  Source of Specimen:  placeta  DISPOSITION OF SPECIMEN:  PATHOLOGY  COUNTS:  YES  TOURNIQUET:  * No tourniquets in log *  DICTATION: .Other Dictation: Dictation Number verbal  PLAN OF CARE: Admit to inpatient   PATIENT DISPOSITION:  PACU - hemodynamically stable.   Delay start of Pharmacological VTE agent (>24hrs) due to surgical blood loss or risk of bleeding: not applicable

## 2016-02-28 NOTE — Anesthesia Procedure Notes (Signed)
Spinal Patient location during procedure: OR Start time: 02/28/2016 3:39 AM End time: 02/28/2016 3:42 AM Staffing Anesthesiologist: Lenard SimmerKARENZ, ANDREW Performed by: anesthesiologist  Preanesthetic Checklist Completed: patient identified, site marked, surgical consent, pre-op evaluation, timeout performed, IV checked, risks and benefits discussed and monitors and equipment checked Spinal Block Patient position: sitting Prep: ChloraPrep Patient monitoring: heart rate, cardiac monitor, continuous pulse ox and blood pressure Approach: midline Location: L3-4 Injection technique: single-shot Needle Needle type: Whitacre and Introducer  Needle gauge: 24 G Needle length: 10 cm Assessment Sensory level: T4 Additional Notes Procedure performed without complication, patient tolerated well.

## 2016-02-28 NOTE — Op Note (Signed)
NAME:  Katelyn Jefferson, Katelyn Jefferson                ACCOUNT NO.:  [id number]  MEDICAL RECORD NO.:  [id number]  LOCATION:  [facility]                         FACILITY:  [facility]  PHYSICIAN:  Jennell Cornerhomas Santasia Rew, MDDATE OF BIRTH:  [birth year]  DATE OF PROCEDURE: DATE OF DISCHARGE:                              OPERATIVE REPORT   PREOPERATIVE DIAGNOSIS: 1. 38+ 4 weeks estimated gestational age. 2. Previous cesarean section. 3. Active labor. 4. Spontaneous rupture of membranes. 5. Elective permanent sterilization.  POSTOPERATIVE DIAGNOSIS: 1. 38+ 4 weeks estimated gestational age. 2. Previous cesarean section. 3. Active labor. 4. Spontaneous rupture of membranes. 5. Elective permanent sterilization.  PROCEDURE PERFORMED: 1. Repeat low transverse cesarean section. 2. Bilateral tubal ligation, [place]. 3. Fetal cord blood collection. 4. Karyotype and microarray.  SURGEON:  Jennell Cornerhomas Daryn Hicks, MD  SURGEON:  Jennell Cornerhomas Maylen Waltermire, MD.  FIRST ASSISTANT:  Street scrub tech.  ANESTHESIA:  Spinal.  INDICATION:  A 30 year old, gravida 3, para 1 patient at 38+ 4 weeks estimated gestational age with spontaneous rupture of membranes and active contractions with cervical dilation.  The patient reconfirms the desire for permanent sterilization.  Fetus was being followed throughout pregnancy for ventriculomegaly and a 2-vessel cord.  Most recent ultrasound by maternal fetal medicine demonstrated ventriculomegaly had resolved.  Pediatric conference and requested fetal karyotype and microarray.  DESCRIPTION OF PROCEDURE:  After adequate spinal anesthesia, the patient was placed in dorsal supine position with a hip roll on the right side. The patient's abdomen was prepped draped normal sterile fashion.  Time- out was performed.  The patient did receive 2 g IV Ancef prior to commencement of the case.  A Pfannenstiel incision was made 2 fingerbreadths above the symphysis pubis.  Sharp dissection was used  to identify the fascia, which was opened in the midline and opened in a transverse fashion.  The superior aspect of fascia was grasped with Kocher clamps and the recti muscles dissected free.  Inferior aspect of the fascia was grasped.  Kocher clamps and pyramidalis muscles dissected free.  Entry into the peritoneal cavity was accomplished sharply.  The vesicular uterine peritoneal fold was identified and a bladder flap was created and the bladder was reflected inferiorly.  A low transverse uterine incision was made upon entry into the endometrial cavity.  Clear fluid resulted.  The fetal head was brought to the incision and vacuum was applied to the occiput with 1 gentle pull.  The fetal head was delivered.  Fetus shoulders and body were delivered without difficulty. Delayed cord clamp on the abdomen while the baby was dried.  Vigorous female was passed to the nursery staff who assigned Apgar scores of 9 and 10.  Weight 3640 g.  Cord was cleansed with Betadine and 10 mL of fetal blood was collected and handed off the table to be used for fetal karyotype and fetal microarray.  The placenta was noted to have an internal knot or kink within.  Therefore, the placenta will be sent to pathology for evaluation.  Cord blood was obtained as well.  Given the Rh status.  The placenta was manually delivered and then uterus was exteriorized.  The endometrial cavity was wiped clean with laparotomy tape and the  ring forceps was used to open the cervix.  The uterine incision was closed with 1 chromic suture in a running, locking fashion. Good approximation of edges.  Good hemostasis noted.  Attention directed to the right fallopian tube with the midportion.  The fallopian tube was picked up with a Babcock clamp.  Two separate 0 plain gut sutures were applied and an 1.5 cm portion of fallopian tube was removed.  Similar procedure was repeated on the patient's left fallopian tube.  After elevating the  fallopian tube, 2 separate 0 plain gut sutures were placed and 1.5 cm portion of fallopian tube was removed.  Good hemostasis was noted.  Posterior cul-de-sac was irrigated and suctioned and the uterus was placed back in the abdominal cavity.  The pericolic gutters were wiped clean with laparotomy tape and both tubal ligation sites appeared hemostatic.  The uterine incision again appeared hemostatic.  An Interceed was placed over the uterine incision in a T-shaped fashion. The fascia was then closed with 0 Vicryl suture in a running, nonlocking fashion.  Good approximation of edges, good hemostasis was noted.  The superior aspect of the incision was undermined to allow for better cosmetic result.  The incision was irrigated and bovied for hemostasis. The Insorb absorbable staples were used to bring the incision back together.  Good cosmetic effect was noted.  There were no complications.  ESTIMATED BLOOD LOSS:  400 mL.  INTRAOPERATIVE FLUIDS:  700 mL.  The patient was taken to recovery room in good condition.          ______________________________ Jennell Corner, MD     TS/MEDQ  D:  02/28/2016  T:  02/28/2016  Job:  [id number]

## 2016-02-28 NOTE — Anesthesia Preprocedure Evaluation (Signed)
Anesthesia Evaluation  Patient identified by MRN, date of birth, ID band Patient awake    Reviewed: Allergy & Precautions, H&P , NPO status , Patient's Chart, lab work & pertinent test results, reviewed documented beta blocker date and time   History of Anesthesia Complications Negative for: history of anesthetic complications  Airway Mallampati: II  TM Distance: >3 FB Neck ROM: full    Dental no notable dental hx. (+) Teeth Intact   Pulmonary neg pulmonary ROS, former smoker,    Pulmonary exam normal breath sounds clear to auscultation       Cardiovascular Exercise Tolerance: Good (-) hypertension(-) angina(-) CAD, (-) Past MI, (-) Cardiac Stents and (-) CABG Normal cardiovascular exam(-) dysrhythmias + Valvular Problems/Murmurs (as a child)  Rhythm:regular Rate:Normal     Neuro/Psych negative neurological ROS  negative psych ROS   GI/Hepatic negative GI ROS, Neg liver ROS,   Endo/Other  negative endocrine ROS  Renal/GU negative Renal ROS  negative genitourinary   Musculoskeletal   Abdominal   Peds  Hematology negative hematology ROS (+)   Anesthesia Other Findings Past Medical History:   Miscarriage                                                  Heart murmur                                                   Comment:Congential heart murmur   Reproductive/Obstetrics (+) Pregnancy                             Anesthesia Physical Anesthesia Plan  ASA: II  Anesthesia Plan: Spinal   Post-op Pain Management:    Induction:   Airway Management Planned:   Additional Equipment:   Intra-op Plan:   Post-operative Plan:   Informed Consent: I have reviewed the patients History and Physical, chart, labs and discussed the procedure including the risks, benefits and alternatives for the proposed anesthesia with the patient or authorized representative who has indicated his/her  understanding and acceptance.   Dental Advisory Given  Plan Discussed with: Anesthesiologist, CRNA and Surgeon  Anesthesia Plan Comments:         Anesthesia Quick Evaluation

## 2016-02-29 LAB — CBC
HCT: 33.7 % — ABNORMAL LOW (ref 35.0–47.0)
HEMOGLOBIN: 11.5 g/dL — AB (ref 12.0–16.0)
MCH: 30.8 pg (ref 26.0–34.0)
MCHC: 34.1 g/dL (ref 32.0–36.0)
MCV: 90.4 fL (ref 80.0–100.0)
PLATELETS: 196 10*3/uL (ref 150–440)
RBC: 3.73 MIL/uL — AB (ref 3.80–5.20)
RDW: 13.7 % (ref 11.5–14.5)
WBC: 10.4 10*3/uL (ref 3.6–11.0)

## 2016-02-29 LAB — RPR: RPR Ser Ql: NONREACTIVE

## 2016-02-29 LAB — FETAL SCREEN: FETAL SCREEN: NEGATIVE

## 2016-02-29 MED ORDER — RHO D IMMUNE GLOBULIN 1500 UNIT/2ML IJ SOSY
300.0000 ug | PREFILLED_SYRINGE | Freq: Once | INTRAMUSCULAR | Status: AC
  Administered 2016-02-29: 300 ug via INTRAVENOUS
  Filled 2016-02-29: qty 2

## 2016-02-29 NOTE — Anesthesia Post-op Follow-up Note (Signed)
  Anesthesia Pain Follow-up Note  Patient: Katelyn Jefferson  Day #: 1  Date of Follow-up: 02/29/2016 Time: 7:15 AM  Last Vitals:  Filed Vitals:   02/28/16 2355 02/29/16 0431  BP: 117/77   Pulse: 92   Temp: 37.1 C 36.8 C  Resp: 18     Level of Consciousness: alert  Pain: mild  Side Effects:None  Catheter Site Exam:site not evaluted  Plan: D/C from anesthesia care  Clydene PughBeane, Capri Veals D

## 2016-02-29 NOTE — Lactation Note (Signed)
This note was copied from a baby's chart.  Lactation Consultation Note  Patient Name: Katelyn Jefferson ZOXWR'UToday's Date: 02/29/2016 Reason for consult: Follow-up assessment   Maternal Data  Mother does not wish to feed the baby at the breast but would prefer to pump and bottle feed the baby a combination of formula and breast milk. Feeding Feeding Type: Breast Milk with Formula added Nipple Type: Slow - flow  LATCH Score/Interventions   Assisted mother with pumping                   Lactation Tools Discussed/Used   Discussed the importance of breast milk  Consult Status  complete    Trudee GripCarolyn P Zenas Santa 02/29/2016, 3:03 PM

## 2016-02-29 NOTE — Progress Notes (Signed)
POSTOPERATIVE DAY # 1 S/P Repeat C/S and BTL   S:         Reports feeling okay, having some soreness and gas pains             Tolerating po intake / no nausea / no vomiting / + flatus / no BM             Bleeding is light             Pain controlled withMotrin and Percocet             Up ad lib / ambulatory/ Catheter out this morning - voided x 1  Newborn breast feeding, pumping and some formula feeding    O:  VS: BP 106/66 mmHg  Pulse 77  Temp(Src) 98.5 F (36.9 C) (Oral)  Resp 16  Ht 5\' 5"  (1.651 m)  Wt 85.276 kg (188 lb)  BMI 31.28 kg/m2  SpO2 97%  LMP 06/03/2015   LABS:               Recent Labs  02/28/16 0149 02/29/16 0545  WBC 12.6* 10.4  HGB 12.6 11.5*  PLT 206 196               Bloodtype: --/--/O NEG (03/22 0149) Baby Blood type: O Positive   Rubella:   Immune                                            I&O: Intake/Output      03/22 0701 - 03/23 0700 03/23 0701 - 03/24 0700   I.V. (mL/kg) 800 (9.4)    Total Intake(mL/kg) 800 (9.4)    Urine (mL/kg/hr) 2200 (1.1)    Blood     Total Output 2200     Net -1400                       Physical Exam:             Alert and Oriented X3  Lungs: Clear and unlabored  Heart: regular rate and rhythm / no mumurs  Abdomen: soft, non-tender, non-distended, BS present in all 4 quadrants             Fundus: firm, non-tender, U-1             Dressing: pressure dressing c/d/i              Incision:  approximated with Insorb absorbable staples / unable to visualize d/t pressure dressing   Perineum: intact  Lochia: scant  Extremities: no edema, no calf pain or tenderness, negative Homans  A:        POD # 1 S/P Repeat C/S and BTL             Rh Negative   Fetus - ventriculomegaly resolved and 2 vessel cord   P:        Routine postoperative care              Administer Rhogam today  May D/C IV today  See lactation today   Anticipate d/c home Saturday   Carlean JewsMeredith Marykate Heuberger, [place]M

## 2016-02-29 NOTE — Anesthesia Postprocedure Evaluation (Signed)
Anesthesia Post Note  Patient: Katelyn Jefferson  Procedure(s) Performed: Procedure(s) (LRB): CESAREAN SECTION (N/A) BILATERAL TUBAL LIGATION (Bilateral)  Patient location during evaluation: Mother Baby Anesthesia Type: Spinal Level of consciousness: awake and alert and oriented Pain management: satisfactory to patient Vital Signs Assessment: post-procedure vital signs reviewed and stable Respiratory status: respiratory function stable Cardiovascular status: stable Postop Assessment: no backache, no headache, spinal receding, patient able to bend at knees, no signs of nausea or vomiting and adequate PO intake Anesthetic complications: no    Last Vitals:  Filed Vitals:   02/28/16 2355 02/29/16 0431  BP: 117/77   Pulse: 92   Temp: 37.1 C 36.8 C  Resp: 18     Last Pain:  Filed Vitals:   02/29/16 0647  PainSc: 9                  Majesti Gambrell, Barbarann Ehlersatherine D

## 2016-03-01 ENCOUNTER — Other Ambulatory Visit: Payer: Medicaid Other

## 2016-03-01 LAB — SURGICAL PATHOLOGY

## 2016-03-01 MED ORDER — BISACODYL 5 MG PO TBEC
10.0000 mg | DELAYED_RELEASE_TABLET | Freq: Every day | ORAL | Status: DC | PRN
  Administered 2016-03-01: 10 mg via ORAL
  Filled 2016-03-01: qty 2

## 2016-03-01 MED ORDER — BISACODYL 10 MG RE SUPP: 10.0000 mg | Freq: Every day | RECTAL | Status: DC | PRN

## 2016-03-01 NOTE — Progress Notes (Signed)
Subjective: Postpartum Day 2: Cesarean Delivery Patient reports some constipation , pain is ok   Objective: Vital signs in last 24 hours: Temp:  [97.7 F (36.5 C)-98.8 F (37.1 C)] 98.8 F (37.1 C) (03/24 1143) Pulse Rate:  [85-99] 85 (03/24 0910) Resp:  [18-20] 20 (03/24 0910) BP: (92-127)/(61-74) 127/74 mmHg (03/24 0910) SpO2:  [99 %-100 %] 99 % (03/24 0910)  Physical Exam:  General: alert and cooperative Lochia: appropriate Uterine Fundus: firm Incision: healing well DVT Evaluation: No evidence of DVT seen on physical exam. Lungs CTa  CV RRR  Recent Labs  02/28/16 0149 02/29/16 0545  HGB 12.6 11.5*  HCT 37.1 33.7*    Assessment/Plan: Status post Cesarean section. Doing well postoperatively.  Continue current care. Add Dulcolax 10 mg po / pr SCHERMERHORN,THOMAS 03/01/2016, 3:27 PM

## 2016-03-01 NOTE — Discharge Summary (Signed)
Obstetric Discharge Summary Reason for Admission: onset of labor and prior c/s , cx dilation Prenatal Procedures: none Intrapartum Procedures: cesarean: low cervical, transverse and tubal ligation Postpartum Procedures: none Complications-Operative and Postpartum: none HEMOGLOBIN  Date Value Ref Range Status  02/29/2016 11.5* 12.0 - 16.0 g/dL Final   HGB  Date Value Ref Range Status  06/11/2013 14.6 12.0-16.0 g/dL Final   HCT  Date Value Ref Range Status  02/29/2016 33.7* 35.0 - 47.0 % Final  06/11/2013 41.8 35.0-47.0 % Final    Physical Exam:  General: Alert and talking and answering questions appropriately Lochia: appropriate, no clots Uterine Fundus: firm Incision: healing well DVT Evaluation: No evidence of DVT seen on physical exam.  Discharge Diagnoses: Term Pregnancy-delivered and repeat c/s and elective BTL Blood type o negative  Baby blood RH+, Rhogam given  , Fetal karyotype and microarray pending  Discharge Information: Date: 03/01/2016 Activity: pelvic rest Diet: routine Medications: Ibuprofen and Percocet Condition: stable Instructions: refer to practice specific booklet Discharge to: home   Newborn Data: Live born female  Birth Weight: 8 lb 0.4 oz (3640 g) APGAR: 9, 10  Home with mother.  SCHERMERHORN,THOMAS 03/01/2016, 3:30 PM

## 2016-03-01 NOTE — Discharge Summary (Signed)
  Obstetric Discharge Summary   Patient ID: Katelyn Jefferson MRN: [id number] DOB/AGE: 02/28/1986 28 y.o.   Date of Admission: 02/28/2016  Date of Discharge: 03/02/16  Admitting Diagnosis: Onset of Labor at 5669w0d  Secondary Diagnosis: RH negative status and 2 Vessel Cord, Resolved fetal ventriculomegaly   Mode of Delivery: repeat cesarean section and tubal ligation was performed by Dr. Feliberto GottronSchermerhorn low transverse uterine incision     Discharge Diagnosis: Reasons for cesarean section  Elective repeat and s/p tubal ligation ABL Anemia on Ferrous Sulfate   Intrapartum Procedures: tubal ligation and LTCS, SROM   Post partum procedures: postpartum tubal ligation  Complications: none   Brief Hospital Course (Cesarean Section): Katelyn Jefferson is a [id number] who underwent cesarean section on 02/28/2016.  Patient had an uncomplicated surgery; for further details of this surgery, please refer to the operative note.  Patient had an uncomplicated postpartum course.  By time of discharge on POD#3, her pain was controlled on oral pain medications; she had appropriate lochia and was ambulating, voiding without difficulty, tolerating regular diet and passing flatus.   She was deemed stable for discharge to home.    Labs: CBC Latest Ref Rng 02/29/2016 02/28/2016 07/20/2015  WBC 3.6 - 11.0 K/uL 10.4 12.6(H) 8.4  Hemoglobin 12.0 - 16.0 g/dL 11.5(L) 12.6 13.6  Hematocrit 35.0 - 47.0 % 33.7(L) 37.1 39.6  Platelets 150 - 440 K/uL 196 206 213   O NEG  Physical exam:  Blood pressure 102/62, pulse 98, temperature 97.7 F (36.5 Jefferson), temperature source Oral, resp. rate 20, height 5\' 5"  (1.651 m), weight 85.276 kg (188 lb), last menstrual period 06/03/2015, SpO2 100 %. General: alert and no distress Lochia: appropriate Abdomen: soft, NT Uterine Fundus: firm Incision: healing well, no significant drainage, no dehiscence, no significant erythema Extremities: No evidence of DVT seen on physical exam. No lower  extremity edema.  Discharge Instructions: Per After Visit Summary. Activity: Advance as tolerated. Pelvic rest for 6 weeks.  Also refer to After Visit Summary Diet: Regular Medications:   Medication List    ASK your doctor about these medications        FLINTSTONES COMPLETE PO  Take 2 tablets by mouth daily. Reported on 01/11/2016       Outpatient follow up:   F/U with Dr. Feliberto GottronSchermerhorn in 2 weeks for post-op  Postpartum contraception: bilateral tubal ligation  Discharged Condition: good  Discharged to: home   Newborn Data:  Baby Boy named "Katelyn Jefferson"  Disposition:home with mother  Apgars: APGAR (1 MIN): 9   APGAR (5 MINS): 10   APGAR (10 MINS):    Baby Feeding: Bottle and Breast  Katelyn AddisonSigmon, Katelyn Jefferson, CNM 03/01/2016

## 2016-03-02 LAB — RHOGAM INJECTION: UNIT DIVISION: 0

## 2016-03-02 MED ORDER — HYDROCODONE-ACETAMINOPHEN 5-325 MG PO TABS: 1.0000 | ORAL_TABLET | Freq: Four times a day (QID) | ORAL | Status: DC | PRN

## 2016-03-02 NOTE — Progress Notes (Signed)
Patient understands all discharge instructions and the need to make follow up appointments. Patient discharge via wheelchair with auxillary. 

## 2016-03-02 NOTE — Discharge Instructions (Signed)
Incision Care: Keep incision area clean, dry and open to air. Shower daily to prevent infection. Only pat incisions, no rubbing or circling the incision area. Use mild soap and warm water to clean incisions. Make sure to dry area completely.  Monitor incision area for redness, severe pain, drainage, or odor, if so contact your physician.  No heavy lifting until cleared by physician.  Take pain medications to manage pain, if pain is increased or unrelieved by medications contact your physician.   Make sure to stay active, ambulate often.  Call your physician if you develop a temperature greater than 100.4, experience any chest pain, shortness of breath.  Make sure to follow up with physician within specified time.  Bleeding: Your bleeding could continue up to 6 weeks, the flow should gradually decrease and the color should become dark then lightened over the next couple of weeks. If you notice you are bleeding heavily or passing clots larger than the size of your fist, PLEASE call your physician. No TAMPONS, DOUCHING, ENEMAS OR SEXUAL INTERCOURSE for 6 weeks.   Stitches: Shower daily with mild soap and water. Stitches will dissolve over the next couple of weeks, if you experience any discomfort in the vaginal area you may sit in warm water 15-20 minutes, 3-4 times per day. Just enough water to cover vaginal area.   AfterPains: This is the uterus contracting back to its normal position and size. Use medications prescribed or recommended by your physician to help relieve this discomfort.   Bowels/Hemorrhoids: Drink plenty of water and stay active. Increase fiber, fresh fruits and vegetables in your diet.   Rest/Activity: Rest when the baby is resting  Bathing: Shower daily!  Diet: Continue to eat extra calories until your follow up visit to help replenish nutrients and vitamins. If breastfeeding eat an extra [phone] and increase your fluid intake to 12 glasses a day.   Contraception: Consult  with your physician on what method of birth control you would like to use.   Postpartum "BLUES": It is common to emotional days after delivery, however if it persist for greater than 2 weeks or if you feel concerned please let your physician know immediately. This is hormone driven and nothing you can control so please let someone know how you feel.  Follow Up Visit: Please schedule a follow up visit with your physician. Bleeding: Your bleeding could continue up to 6 weeks, the flow should gradually decrease and the color should become dark then lightened over the next couple of weeks. If you notice you are bleeding heavily or passing clots larger than the size of your fist, PLEASE call your physician. No TAMPONS, DOUCHING, ENEMAS OR SEXUAL INTERCOURSE for 6 weeks.   Stitches: Shower daily with mild soap and water. Stitches will dissolve over the next couple of weeks, if you experience any discomfort in the vaginal area you may sit in warm water 15-20 minutes, 3-4 times per day. Just enough water to cover vaginal area.   AfterPains: This is the uterus contracting back to its normal position and size. Use medications prescribed or recommended by your physician to help relieve this discomfort.   Bowels/Hemorrhoids: Drink plenty of water and stay active. Increase fiber, fresh fruits and vegetables in your diet.   Rest/Activity: Rest when the baby is resting  Bathing: Shower daily!  Diet: Continue to eat extra calories until your follow up visit to help replenish nutrients and vitamins. If breastfeeding eat an extra [phone] and increase your fluid intake to  12 glasses a day.   Contraception: Consult with your physician on what method of birth control you would like to use.   Postpartum "BLUES": It is common to emotional days after delivery, however if it persist for greater than 2 weeks or if you feel concerned please let your physician know immediately. This is hormone driven and nothing you can  control so please let someone know how you feel.  Follow Up Visit: Please schedule a follow up visit with your physician.

## 2016-03-02 NOTE — Progress Notes (Signed)
  Subjective:  Wants to go home today   Objective:  Blood pressure 124/80, pulse 118, temperature 98.3 F (36.8 C), temperature source Oral, resp. rate 20, height 5\' 5"  (1.651 m), weight 188 lb (85.276 kg), last menstrual period 06/03/2015, SpO2 99 %.  General: NAD Cardiac: S1S2, RRR, No M/R/G. Pulmonary: no increased work of breathing,, Lungs CTA bilat, NO W/R/R. Abdomen: non-distended, non-tender, fundus firm at level of umbilicus Incision: internal sutures, no drainage, intact. Extremities: no edema, no erythema, no tenderness  Results for orders placed or performed during the hospital encounter of 02/28/16 (from the past 72 hour(s))  CBC     Status: Abnormal   Collection Time: 02/29/16  5:45 AM  Result Value Ref Range   WBC 10.4 3.6 - 11.0 K/uL   RBC 3.73 (L) 3.80 - 5.20 MIL/uL   Hemoglobin 11.5 (L) 12.0 - 16.0 g/dL   HCT 16.133.7 (L) 09.635.0 - 04.547.0 %   MCV 90.4 80.0 - 100.0 fL   MCH 30.8 26.0 - 34.0 pg   MCHC 34.1 32.0 - 36.0 g/dL   RDW 40.913.7 81.111.5 - 91.414.5 %   Platelets 196 150 - 440 K/uL  Fetal screen     Status: None   Collection Time: 02/29/16  5:45 AM  Result Value Ref Range   Fetal Screen NEG   Rhogam injection     Status: None   Collection Time: 02/29/16  5:45 AM  Result Value Ref Range   Unit Number [id number]    Blood Component Type RHIG    Unit division 00    Status of Unit ISSUED,FINAL    Transfusion Status OK TO TRANSFUSE      Assessment:   30 y.o. [id number] postoperativeday # 3   Plan:  1) Acute blood loss anemia - hemodynamically stable and asymptomatic - po ferrous sulfate  2) --/--/O NEG (03/22 0149) /   / Varicella Immune  3) TDAP status: UTD   4) Breast/Bottle/Contraception: will discuss at 6 weeks  5) Disposition: stable to home

## 2016-03-04 ENCOUNTER — Inpatient Hospital Stay
Admission: RE | Admit: 2016-03-04 | Payer: Medicaid Other | Source: Ambulatory Visit | Admitting: Obstetrics and Gynecology

## 2016-03-04 ENCOUNTER — Encounter: Admission: RE | Payer: Self-pay | Source: Ambulatory Visit

## 2016-03-04 SURGERY — Surgical Case
Anesthesia: Regional | Laterality: Bilateral

## 2016-12-06 ENCOUNTER — Ambulatory Visit
Admission: EM | Admit: 2016-12-06 | Discharge: 2016-12-06 | Disposition: A | Discharge status: Home / Self Care | Payer: Medicaid Other | Attending: Family Medicine | Admitting: Family Medicine

## 2016-12-06 ENCOUNTER — Encounter: Payer: Self-pay | Admitting: Gynecology

## 2016-12-06 DIAGNOSIS — J0100 Acute maxillary sinusitis, unspecified: Secondary | ICD-10-CM

## 2016-12-06 DIAGNOSIS — R05 Cough: Secondary | ICD-10-CM

## 2016-12-06 DIAGNOSIS — R059 Cough, unspecified: Secondary | ICD-10-CM

## 2016-12-06 MED ORDER — LORATADINE 5 MG PO CHEW: 5.0000 mg | CHEWABLE_TABLET | Freq: Two times a day (BID) | ORAL | 0 refills | Status: DC | PRN

## 2016-12-06 MED ORDER — AMOXICILLIN-POT CLAVULANATE 400-57 MG/5ML PO SUSR: 800.0000 mg | Freq: Two times a day (BID) | ORAL | 0 refills | Status: AC

## 2016-12-06 NOTE — ED Triage Notes (Signed)
Per patient x over a week with cold symptoms. Per patient x today had a temperature of 100.
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
## 2017-03-01 ENCOUNTER — Ambulatory Visit
Admission: EM | Admit: 2017-03-01 | Discharge: 2017-03-01 | Disposition: A | Discharge status: Home / Self Care | Payer: Medicaid Other | Attending: Family Medicine | Admitting: Family Medicine

## 2017-03-01 DIAGNOSIS — R05 Cough: Secondary | ICD-10-CM

## 2017-03-01 DIAGNOSIS — J209 Acute bronchitis, unspecified: Secondary | ICD-10-CM | POA: Diagnosis not present

## 2017-03-01 DIAGNOSIS — R0602 Shortness of breath: Secondary | ICD-10-CM

## 2017-03-01 MED ORDER — HYDROCOD POLST-CPM POLST ER 10-8 MG/5ML PO SUER: 5.0000 mL | Freq: Two times a day (BID) | ORAL | 0 refills | Status: DC | PRN

## 2017-03-01 MED ORDER — AZITHROMYCIN 200 MG/5ML PO SUSR: 500.0000 mg | Freq: Every day | ORAL | 0 refills | Status: AC

## 2017-03-01 NOTE — ED Provider Notes (Signed)
[facility]    CSN: [id number] Arrival date & time: 03/01/17  [id number]     History   Chief Complaint Chief Complaint  Patient presents with  . Cough    HPI Katelyn Jefferson is a 31 y.o. female.   Patient reports coughing now for about 4 weeks having persistent cough with some shortness of breath last night she states was worse. She states for over weeks she's been coughing up greenish phlegm last night degrees from was a lot worse and she was unable to sleep last night. He was having URI symptoms earlier and having some swollen lymph glands and and nasal drainage but then the cough started and has progressed. She reports having bronchitis before but uses an anterior the chest this 1 posterior. She's never had pneumonia. She does smoke. She denies any fever. Previous surgeries 2 C-sections last one under a year ago. She's had a history of heart murmur for the past and a miscarriage. She's also had a colonoscopy and tubal ligation before. She is allergic to sulfa antibiotics and sulfa drugs. No pertinent family medical history relevant to today's visit.   The history is provided by the patient. No language interpreter was used.  Cough  Cough characteristics:  Productive, hacking and nocturnal Sputum characteristics:  Green and yellow Severity:  Moderate Onset quality:  Sudden Duration:  4 weeks Timing:  Constant Progression:  Worsening Chronicity:  New Smoker: no   Context: smoke exposure and upper respiratory infection   Relieved by:  Nothing Ineffective treatments:  Cough suppressants Associated symptoms: shortness of breath   Associated symptoms: no wheezing     Past Medical History:  Diagnosis Date  . Heart murmur    Congential heart murmur  . Miscarriage     Patient Active Problem List   Diagnosis Date Noted  . Postoperative state 02/28/2016  . Labor and delivery indication for care or intervention 01/25/2016  . Decreased fetal movement 10/27/2015  .  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 History of poor fetal growth 08/31/2015    Past Surgical History:  Procedure Laterality Date  . CESAREAN SECTION    . CESAREAN SECTION N/A 02/28/2016   Procedure: CESAREAN SECTION;  Surgeon: Suzy Bouchardhomas J Schermerhorn, MD;  Location: [facility] ORS;  Service: Obstetrics;  Laterality: N/A;  . COLONOSCOPY  31 years old  . TUBAL LIGATION Bilateral 02/28/2016   Procedure: BILATERAL TUBAL LIGATION;  Surgeon: Suzy Bouchardhomas J Schermerhorn, MD;  Location: [facility] ORS;  Service: Obstetrics;  Laterality: Bilateral;    OB History    Gravida Para Term Preterm AB Living   3 1   1 1 1    SAB TAB Ectopic Multiple Live Births   1       1       Home Medications    Prior to Admission medications   Medication Sig Start Date End Date Taking? Authorizing Provider  azithromycin (ZITHROMAX) 200 MG/5ML suspension Take 12.5 mLs (500 mg total) by mouth daily. For 4 days and on the fifth day just take 10ml 03/01/17 03/05/17  Hassan RowanEugene Ileta Ofarrell, MD  chlorpheniramine-HYDROcodone [facility](TUSSIONEX PENNKINETIC ER) 10-8 MG/5ML SUER Take 5 mLs by mouth every 12 (twelve) hours as needed. 03/01/17   Hassan RowanEugene Thanh Mottern, MD  HYDROcodone-acetaminophen (NORCO) 5-325 MG tablet Take 1 tablet by mouth every 6 (six) hours as needed for moderate pain. 03/02/16   Sharee Pimplearon W Jones, CNM  loratadine (CLARITIN) 5 MG chewable tablet Chew 1 tablet (5 mg total) by mouth 2 (two) times daily as  needed for allergies. 12/06/16   Hassan Rowan, MD  Pediatric Multivit-Minerals-[facility] COMPLETE PO) Take 2 tablets by mouth daily. Reported on 01/11/2016    Historical Provider, MD    Family History History reviewed. No pertinent family history.  Social History Social History  Substance Use Topics  . Smoking status: Current Every Day Smoker    Packs/day: 0.50    Types: Cigarettes  . Smokeless tobacco: Never Used  . Alcohol use No     Allergies   Sulfa antibiotics and Sulfur   Review of Systems Review of Systems  Respiratory: Positive  for cough and shortness of breath. Negative for wheezing.   All other systems reviewed and are negative.    Physical Exam Triage Vital Signs ED Triage Vitals  Enc Vitals Group     BP 03/01/17 0947 112/71     Pulse Rate 03/01/17 0947 89     Resp 03/01/17 0947 18     Temp 03/01/17 0947 98.5 F (36.9 C)     Temp Source 03/01/17 0947 Oral     SpO2 03/01/17 0947 98 %     Weight 03/01/17 0944 155 lb (70.3 kg)     Height 03/01/17 0944 5\' 5"  (1.651 m)     Head Circumference --      Peak Flow --      Pain Score --      Pain Loc --      Pain Edu? --      Excl. in GC? --    No data found.   Updated Vital Signs BP 112/71 (BP Location: Right Arm)   Pulse 89   Temp 98.5 F (36.9 C) (Oral)   Resp 18   Ht 5\' 5"  (1.651 m)   Wt 155 lb (70.3 kg)   LMP 01/30/2017   SpO2 98%   BMI 25.79 kg/m   Visual Acuity Right Eye Distance:   Left Eye Distance:   Bilateral Distance:    Right Eye Near:   Left Eye Near:    Bilateral Near:     Physical Exam  Constitutional: She is oriented to person, place, and time. She appears well-developed and well-nourished.  HENT:  Head: Normocephalic and atraumatic.  Right Ear: External ear normal.  Left Ear: External ear normal.  Nose: Nose normal.  Mouth/Throat: Oropharynx is clear and moist.  Eyes: Pupils are equal, round, and reactive to light.  Neck: Normal range of motion. Neck supple.  Cardiovascular: Normal rate and regular rhythm.   Pulmonary/Chest: Effort normal and breath sounds normal.  Musculoskeletal: Normal range of motion.  Lymphadenopathy:    She has cervical adenopathy.  Neurological: She is alert and oriented to person, place, and time.  Skin: Skin is warm.  Psychiatric: She has a normal mood and affect.  Vitals reviewed.    UC Treatments / Results  Labs (all labs ordered are listed, but only abnormal results are displayed) Labs Reviewed - No data to display  EKG  EKG Interpretation None       Radiology No  results found.  Procedures Procedures (including critical care time)  Medications Ordered in UC Medications - No data to display   Initial Impression / Assessment and Plan / UC Course  I have reviewed the triage vital signs and the nursing notes.  Pertinent labs & imaging results that were available during my care of the patient were reviewed by me and considered in my medical decision making (see chart for details).   patient declined chest  x-ray. Will by by those wishes. Stressed though that she's not better she'll need to come back to be seen. She is also informed me that she does not take pills once all things liquid. She denies having any wheezing. We'll place on Tussionex and will place on Zithromax position once the liquid form will give her 500 mg Zithromax for 4 days or total of 12.5 ML's to even out the 60 mL's should have 10 mL's myalgias have her take the 10 mL 1 day 5. Also stressed importance stop smoking  Final Clinical Impressions(s) / UC Diagnoses   Final diagnoses:  Acute bronchitis, unspecified organism    New Prescriptions New Prescriptions   AZITHROMYCIN (ZITHROMAX) 200 MG/5ML SUSPENSION    Take 12.5 mLs (500 mg total) by mouth daily. For 4 days and on the fifth day just take 10ml   CHLORPHENIRAMINE-HYDROCODONE (TUSSIONEX PENNKINETIC ER) 10-8 MG/5ML SUER    Take 5 mLs by mouth every 12 (twelve) hours as needed.     Note: This dictation was prepared with Dragon dictation along with smaller phrase technology. Any transcriptional errors that result from this process are unintentional.   Hassan Rowan, MD 03/01/17 [phone]

## 2017-03-01 NOTE — ED Triage Notes (Signed)
Pt reports cough which is worse at night. Coughing up green phlegm and blowing green out of nose. No pain no fever.

## 2017-03-01 NOTE — Discharge Instructions (Signed)
Strongly recommend stop smoking if this does not cure the infection and return for chest x-ray

## 2017-04-24 ENCOUNTER — Encounter: Payer: Self-pay | Admitting: Emergency Medicine

## 2017-04-24 ENCOUNTER — Ambulatory Visit
Admission: EM | Admit: 2017-04-24 | Discharge: 2017-04-24 | Disposition: A | Discharge status: Home / Self Care | Payer: Medicaid Other | Attending: Family Medicine | Admitting: Family Medicine

## 2017-04-24 DIAGNOSIS — J0101 Acute recurrent maxillary sinusitis: Secondary | ICD-10-CM | POA: Diagnosis not present

## 2017-04-24 DIAGNOSIS — F17200 Nicotine dependence, unspecified, uncomplicated: Secondary | ICD-10-CM | POA: Diagnosis not present

## 2017-04-24 MED ORDER — AZITHROMYCIN 200 MG/5ML PO SUSR: 200.0000 mg | Freq: Every day | ORAL | 0 refills | Status: DC

## 2017-04-24 NOTE — ED Triage Notes (Signed)
Patient c/o sinus congestion and pressure that started on Saturday.

## 2017-04-24 NOTE — Discharge Instructions (Signed)
-  amoxicillin 500mg  (12.695ml) daily for 5 days -can use OTC nasal rinse to help clear sinus congestion -can use OTC Flonase or Afrin for sinus symptoms -can use OTC Tylenol or ibuprofen for pain or fever -follow up with PCP or urgent care should symptoms worsen or not improve

## 2017-04-24 NOTE — ED Provider Notes (Signed)
CSN: [id number]     Arrival date & time 04/24/17  1150 History   First MD Initiated Contact with Patient 04/24/17 1230     Chief Complaint  Patient presents with  . Sinus Problem   (Consider location/radiation/quality/duration/timing/severity/associated sxs/prior Treatment) Patient is a 31 year-old female who presents with complaint of sinus pressure and congestion that started Saturday. Patient denies any ear pain or cough but does report production of yellowish greenish mucus. Patient was seen in this clinic in March for similar symptoms but states her symptoms improved after that visit upon completing her antibiotic regimen. Patient reports she still smoking 1 pack per day but states she has not had a seizure today and has had here cigarettes last few days. Patient does report being in a local restaurant that has a playground inside where she did have some contact with a sick child, this is approximately 2 days before the start of her symptoms. Patient denies any abdominal symptoms or chest pain. Patient does report some chronic shortness of breath due to her smoking.      Past Medical History:  Diagnosis Date  . Heart murmur    Congential heart murmur  . Miscarriage    Past Surgical History:  Procedure Laterality Date  . CESAREAN SECTION    . CESAREAN SECTION N/A 02/28/2016   Procedure: CESAREAN SECTION;  Surgeon: Suzy Bouchardhomas J Schermerhorn, MD;  Location: [facility];  Service: Obstetrics;  Laterality: N/A;  . COLONOSCOPY  31 years old  . TUBAL LIGATION Bilateral 02/28/2016   Procedure: BILATERAL TUBAL LIGATION;  Surgeon: Suzy Bouchardhomas J Schermerhorn, MD;  Location: [facility];  Service: Obstetrics;  Laterality: Bilateral;   History reviewed. No pertinent family history. Social History  Substance Use Topics  . Smoking status: Current Every Day Smoker    Packs/day: 0.50    Types: Cigarettes  . Smokeless tobacco: Never Used  . Alcohol use No   OB History    Gravida Para Term Preterm AB Living    3 1   1 1 1    SAB TAB Ectopic Multiple Live Births   1       1     Review of Systems  Constitutional: Negative for chills and fever.  HENT: Positive for congestion, sinus pain and sinus pressure. Negative for ear pain.   Eyes: Negative.   Respiratory: Negative.  Negative for cough, chest tightness and shortness of breath.   Cardiovascular: Negative.  Negative for chest pain.  Gastrointestinal: Negative.  Negative for abdominal pain.  Genitourinary: Negative.   Musculoskeletal: Negative.   Neurological: Negative.     Allergies  Sulfa antibiotics and Sulfur  Home Medications   Prior to Admission medications   Medication Sig Start Date End Date Taking? Authorizing Provider  azithromycin (ZITHROMAX) 200 MG/5ML suspension Take 5 mLs (200 mg total) by mouth daily. 04/24/17   Candis SchatzHarris, Johara Lodwick D, PA-C   Meds Ordered and Administered this Visit  Medications - No data to display  BP 101/66 (BP Location: Left Arm)   Pulse 79   Temp 98.3 F (36.8 C) (Oral)   Resp 16   Ht 5\' 5"  (1.651 m)   Wt 160 lb (72.6 kg)   LMP 04/10/2017 (Approximate)   SpO2 99%   BMI 26.63 kg/m  No data found.   Physical Exam  Constitutional: She is oriented to person, place, and time. She appears well-developed and well-nourished. No distress.  HENT:  Right Ear: Tympanic membrane and ear canal normal.  Left Ear: Tympanic membrane  and ear canal normal.  Nose: Right sinus exhibits maxillary sinus tenderness and frontal sinus tenderness. Left sinus exhibits maxillary sinus tenderness and frontal sinus tenderness.  Eyes: EOM are normal. Pupils are equal, round, and reactive to light.  Neck: Normal range of motion. Neck supple.  Cardiovascular: Regular rhythm and normal heart sounds.   Pulmonary/Chest: Effort normal and breath sounds normal.  Abdominal: Soft. Bowel sounds are normal.  Musculoskeletal: Normal range of motion.  Neurological: She is alert and oriented to person, place, and time.  Skin: Skin  is warm and dry.    Urgent Care Course     Procedures (including critical care time)  Labs Review Labs Reviewed - No data to display  Imaging Review No results found.   MDM   1. Acute recurrent maxillary sinusitis   2. Smoking    New Prescriptions   AZITHROMYCIN (ZITHROMAX) 200 MG/5ML SUSPENSION    Take 5 mLs (200 mg total) by mouth daily.   Patient presents with recurrent sinus infection. Patient states sinus pressure and congestion started this past Saturday, possibly Dr. close proximity to a sick child at [employer] with the playground. Patient without cough or ear symptoms at this time. Patient reports unable to take by mouth medications will give her a suspension of azithromycin 500 mg daily for 5 days, this seemed to help her back in March with similar symptoms. Recommend over-the-counter nasal saline rinse to help with clearing her sinus congestion as well as Afrin and Flonase for symptom management. Patient again advised to stop smoking of her overall health, her recurrent sinusitis, as well for her small child. Patient verbalized understanding recommendations and is in agreement with plan.  Candis Schatz, PA-C     Candis Schatz, PA-C 04/24/17 1322

## 2017-07-31 ENCOUNTER — Encounter: Payer: Self-pay | Admitting: Obstetrics and Gynecology

## 2017-07-31 ENCOUNTER — Telehealth: Payer: Self-pay | Admitting: Obstetrics and Gynecology

## 2017-07-31 ENCOUNTER — Ambulatory Visit (INDEPENDENT_AMBULATORY_CARE_PROVIDER_SITE_OTHER): Payer: Medicaid Other | Admitting: Obstetrics and Gynecology

## 2017-07-31 VITALS — BP 119/74 | HR 65 | Ht 65.0 in | Wt 154.2 lb

## 2017-07-31 DIAGNOSIS — B9689 Other specified bacterial agents as the cause of diseases classified elsewhere: Secondary | ICD-10-CM

## 2017-07-31 DIAGNOSIS — N760 Acute vaginitis: Secondary | ICD-10-CM | POA: Diagnosis not present

## 2017-07-31 DIAGNOSIS — N938 Other specified abnormal uterine and vaginal bleeding: Secondary | ICD-10-CM | POA: Diagnosis not present

## 2017-07-31 DIAGNOSIS — R1032 Left lower quadrant pain: Secondary | ICD-10-CM | POA: Diagnosis not present

## 2017-07-31 MED ORDER — METRONIDAZOLE 500 MG PO TABS
ORAL_TABLET | ORAL | 0 refills | Status: DC
Start: 2017-07-31 — End: 2017-11-04

## 2017-07-31 MED ORDER — DESOGESTREL-ETHINYL ESTRADIOL 0.15-0.02/0.01 MG (21/5) PO TABS: 1.0000 | ORAL_TABLET | Freq: Every day | ORAL | 2 refills | Status: DC

## 2017-07-31 NOTE — Progress Notes (Signed)
HPI:      Katelyn Jefferson is a 31 y.o. [id number] who LMP was Patient's last menstrual period was 07/18/2017 (exact date).  Subjective:   She presents today  With multiple specific complaints. 1.   She has a " knot" above her C-section scar that hurts all the time but is much more painful when she has her period.   It has been there for about 6 months. 2.  She says that her menstrual periods have become much more painful over the last year. 3.  She complains of a history of bacterial vaginosis and of severe odor after intercourse. 4.  Complains of occasional vaginal bleeding after bowel movements. 5.  The patient has anal sex and is wondering if any of the above is related to this.  Patient has a tubal ligation for birth control. No history of colitis or irritable bowel-denies diarrhea. Denies stool or gas from the vagina.    Hx: The following portions of the patient's history were reviewed and updated as appropriate:             She  has a past medical history of Heart murmur and Miscarriage. She  does not have any pertinent problems on file. She  has a past surgical history that includes Cesarean section; Colonoscopy (31 years old); Cesarean section (N/A, 02/28/2016); and Tubal ligation (Bilateral, 02/28/2016). Her family history includes Cancer in her paternal grandfather; Stroke in her mother. She  reports that she has been smoking Cigarettes.  She has been smoking about 0.50 packs per day. She has never used smokeless tobacco. She reports that she does not drink alcohol or use drugs. She is allergic to cephalexin; sulfa antibiotics; and sulfur.       Review of Systems:  Review of Systems  Constitutional: Denied constitutional symptoms, night sweats, recent illness, fatigue, fever, insomnia and weight loss.  Eyes: Denied eye symptoms, eye pain, photophobia, vision change and visual disturbance.  Ears/Nose/Throat/Neck: Denied ear, nose, throat or neck symptoms, hearing loss, nasal  discharge, sinus congestion and sore throat.  Cardiovascular: Denied cardiovascular symptoms, arrhythmia, chest pain/pressure, edema, exercise intolerance, orthopnea and palpitations.  Respiratory: Denied pulmonary symptoms, asthma, pleuritic pain, productive sputum, cough, dyspnea and wheezing.  Gastrointestinal: Denied, gastro-esophageal reflux, melena, nausea and vomiting.  Genitourinary: See HPI for additional information.  Musculoskeletal: Denied musculoskeletal symptoms, stiffness, swelling, muscle weakness and myalgia.  Dermatologic: Denied dermatology symptoms, rash and scar.  Neurologic: Denied neurology symptoms, dizziness, headache, neck pain and syncope.  Psychiatric: Denied psychiatric symptoms, anxiety and depression.  Endocrine: Denied endocrine symptoms including hot flashes and night sweats.   Meds:   Current Outpatient Prescriptions on File Prior to Visit  Medication Sig Dispense Refill  . azithromycin (ZITHROMAX) 200 MG/5ML suspension Take 5 mLs (200 mg total) by mouth daily. (Patient not taking: Reported on 07/31/2017) 62.5 mL 0   No current facility-administered medications on file prior to visit.     Objective:     Vitals:   07/31/17 1126  BP: 119/74  Pulse: 65              Physical examination   Pelvic:   Vulva: Normal appearance.  No lesions.  Vagina: No lesions or abnormalities noted.  No evidence of fistula   Support: Normal pelvic support.  Urethra No masses tenderness or scarring.  Meatus Normal size without lesions or prolapse.  Cervix: Normal appearance.  No lesions.  Small amount of dark blood from cervical os.   Anus:  Normal exam.  No lesions.  Perineum: Normal exam.  No lesions.        Bimanual   Uterus: Normal size.  Non-tender.  Mobile.  AV.  Adnexae: No masses.  Non-tender to palpation.  Cul-de-sac: Negative for abnormality.   Abdominal examination reveals a 2 cm diameter nodule which palpate subcuticular  WET PREP: clue cells:  present, KOH (yeast): negative, odor: present and trichomoniasis: negative Ph:  > 4.5   Assessment:    [id number] Patient Active Problem List   Diagnosis Date Noted  . Postoperative state 02/28/2016  . Labor and delivery indication for care or intervention 01/25/2016  . Decreased fetal movement 10/27/2015  . 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 History of poor fetal growth 08/31/2015     1. Bacterial vulvovaginitis   2. Left lower quadrant pain   3. DUB (dysfunctional uterine bleeding)     Recurrent bacterial vaginosis.  Possible endometriosis  in old C-section scar and causing her current dysmenorrhea.  No evidence of rectovaginal fistula at this time.   Plan:            1.  Flagyl - we have discussed the use of acidophilus/lactobacilli in prevention of recurrence. We have discussed anal intercourse and especially anal intercourse and vaginal intercourse and the relationship to BV.  2.  OCPs for 3 months as a trial to control dysfunctional bleeding and possible suppression of endometriosis. OCPs The risks /benefits of OCPs have been explained to the patient in detail.  Product literature has been given to her.  I have instructed her in the use of OCPs and have given her literature reinforcing this information.  I have explained to the patient that OCPs are not as effective for birth control during the first month of use, and that another form of contraception should be used during this time.  Both first-day start and Sunday start have been explained.  The risks and benefits of each was discussed.  She has been made aware of  the fact that other medications may affect the efficacy of OCPs.  I have answered all of her questions, and I believe that she has an understanding of the effectiveness and use of OCPs.   3.  Consider further workup if necessary which may include surgical consult for possible hernia.  Remote chance of fistula which may require a barium enema.    Orders No  orders of the defined types were placed in this encounter.    Meds ordered this encounter  Medications  . metroNIDAZOLE (FLAGYL) 500 MG tablet    Sig: Pt desires liquid if possible    Dispense:  14 tablet    Refill:  0  . desogestrel-ethinyl estradiol (MIRCETTE) 0.15-0.02/0.01 MG (21/5) tablet    Sig: Take 1 tablet by mouth at bedtime.    Dispense:  1 Package    Refill:  2        F/U  No Follow-up on file. I spent 45 minutes with this patient of which greater than 50% was spent discussing endometriosis, OCPs, abdominal wall hernia, rectovaginal fistula, BV.  Elonda Husky, M.D. 07/31/2017 12:23 PM

## 2017-07-31 NOTE — Telephone Encounter (Signed)
Please call pharmacy with instructions for medication metronidazole - only states patient wants liquid

## 2017-10-10 ENCOUNTER — Encounter: Payer: Self-pay | Admitting: Obstetrics and Gynecology

## 2017-10-28 ENCOUNTER — Encounter: Payer: Self-pay | Admitting: Emergency Medicine

## 2017-10-28 ENCOUNTER — Emergency Department
Admission: EM | Admit: 2017-10-28 | Discharge: 2017-10-28 | Disposition: A | Discharge status: Home / Self Care | Payer: Medicaid Other | Attending: Emergency Medicine | Admitting: Emergency Medicine

## 2017-10-28 DIAGNOSIS — R1032 Left lower quadrant pain: Secondary | ICD-10-CM | POA: Insufficient documentation

## 2017-10-28 DIAGNOSIS — F17210 Nicotine dependence, cigarettes, uncomplicated: Secondary | ICD-10-CM | POA: Insufficient documentation

## 2017-10-28 DIAGNOSIS — Z79899 Other long term (current) drug therapy: Secondary | ICD-10-CM | POA: Insufficient documentation

## 2017-10-28 LAB — COMPREHENSIVE METABOLIC PANEL
ALBUMIN: 4.1 g/dL (ref 3.5–5.0)
ALK PHOS: 79 U/L (ref 38–126)
ALT: 24 U/L (ref 14–54)
ANION GAP: 10 (ref 5–15)
AST: 27 U/L (ref 15–41)
BILIRUBIN TOTAL: 0.6 mg/dL (ref 0.3–1.2)
BUN: 15 mg/dL (ref 6–20)
CALCIUM: 9.4 mg/dL (ref 8.9–10.3)
CO2: 23 mmol/L (ref 22–32)
Chloride: 105 mmol/L (ref 101–111)
Creatinine, Ser: 0.94 mg/dL (ref 0.44–1.00)
GFR calc Af Amer: >60 mL/min (ref >60)
GFR calc non Af Amer: >60 mL/min (ref >60)
GLUCOSE: 98 mg/dL (ref 65–99)
Potassium: 3.8 mmol/L (ref 3.5–5.1)
SODIUM: 138 mmol/L (ref 135–145)
TOTAL PROTEIN: 7.5 g/dL (ref 6.5–8.1)

## 2017-10-28 LAB — URINALYSIS, COMPLETE (UACMP) WITH MICROSCOPIC
BACTERIA UA: NONE SEEN
Bilirubin Urine: NEGATIVE
Glucose, UA: NEGATIVE mg/dL
Hgb urine dipstick: NEGATIVE
Ketones, ur: NEGATIVE mg/dL
Leukocytes, UA: NEGATIVE
Nitrite: NEGATIVE
PROTEIN: NEGATIVE mg/dL
SPECIFIC GRAVITY, URINE: 1.029 (ref 1.005–1.030)
pH: 6 (ref 5.0–8.0)

## 2017-10-28 LAB — CBC
HEMATOCRIT: 46.1 % (ref 35.0–47.0)
HEMOGLOBIN: 15.7 g/dL (ref 12.0–16.0)
MCH: 30.1 pg (ref 26.0–34.0)
MCHC: 34 g/dL (ref 32.0–36.0)
MCV: 88.4 fL (ref 80.0–100.0)
Platelets: 242 10*3/uL (ref 150–440)
RBC: 5.22 MIL/uL — ABNORMAL HIGH (ref 3.80–5.20)
RDW: 12.9 % (ref 11.5–14.5)
WBC: 8.3 10*3/uL (ref 3.6–11.0)

## 2017-10-28 LAB — POCT PREGNANCY, URINE: PREG TEST UR: NEGATIVE

## 2017-10-28 NOTE — ED Triage Notes (Signed)
Patient presents to the ED with tenderness to area on her left lower quadrant x 1 year.  Patient reports palpating a small mass there, states, "I don't know if it's a hernia, or what."  Patient states she noticed the area approx. 1 year ago and has had some pain since then but states it is getting worse.  Patient states tenderness is more severe during menstruation and is more painful with coughing or sneezing.

## 2017-10-28 NOTE — ED Provider Notes (Signed)
[facility] Emergency Department Provider Note  ____________________________________________   First MD Initiated Contact with Patient 10/28/17 1944     (approximate)  I have reviewed the triage vital signs and the nursing notes.   HISTORY  Chief Complaint Abdominal Pain (Small tender mass to left lower quadrant near c-section scar)   HPI Katelyn Jefferson is a [age] y.o. female with a history of a C-section in March 2017 was presented to the emergency department with a lump to the upper left outer portion of her C-section scar that is been present for about 1 year.  She says that she is concerned because she started exercising about a week after the C-section delivery, doing sit ups.  She says that she has had abdominal pain ever since then.  She says that she also has noticed this lump that has slightly increased in size over the past year and is painful especially around the times of her periods.  She said that she was seen by [facility] and recommended to start her birth control but she did not start the birth control because she says that her mother had a stroke that paralyzed on the right side at 29 years old because of being on birth control.  The patient says that she smokes as well.  Denies any difficulty with nausea, vomiting or diarrhea.  Does not report any vaginal bleeding or discharge.  Says that the presumed diagnosis was possible secondary endometriosis versus a hernia from [facility].  Past Medical History:  Diagnosis Date  . Heart murmur    Congential heart murmur  . Miscarriage     Patient Active Problem List   Diagnosis Date Noted  . Postoperative state 02/28/2016  . Labor and delivery indication for care or intervention 01/25/2016  . Decreased fetal movement 10/27/2015  . 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 History of poor fetal growth 08/31/2015    Past Surgical History:  Procedure Laterality Date  . CESAREAN SECTION    .  CESAREAN SECTION N/A 02/28/2016   Procedure: CESAREAN SECTION;  Surgeon: Suzy Bouchardhomas J Schermerhorn, MD;  Location: [facility] ORS;  Service: Obstetrics;  Laterality: N/A;  . COLONOSCOPY  31 years old  . TUBAL LIGATION Bilateral 02/28/2016   Procedure: BILATERAL TUBAL LIGATION;  Surgeon: Suzy Bouchardhomas J Schermerhorn, MD;  Location: [facility] ORS;  Service: Obstetrics;  Laterality: Bilateral;    Prior to Admission medications   Medication Sig Start Date End Date Taking? Authorizing Provider  azithromycin (ZITHROMAX) 200 MG/5ML suspension Take 5 mLs (200 mg total) by mouth daily. Patient not taking: Reported on 07/31/2017 04/24/17   Candis SchatzHarris, Michael D, PA-C  desogestrel-ethinyl estradiol (MIRCETTE) 0.15-0.02/0.01 MG (21/5) tablet Take 1 tablet by mouth at bedtime. 07/31/17   Linzie CollinEvans, David James, MD  metroNIDAZOLE (FLAGYL) 500 MG tablet Pt desires liquid if possible 07/31/17   Linzie CollinEvans, David James, MD    Allergies Cephalexin; Sulfa antibiotics; and Sulfur  Family History  Problem Relation Age of Onset  . Stroke Mother   . Cancer Paternal Grandfather     Social History Social History   Tobacco Use  . Smoking status: Current Every Day Smoker    Packs/day: 0.50    Types: Cigarettes  . Smokeless tobacco: Never Used  Substance Use Topics  . Alcohol use: No  . Drug use: No    Review of Systems  Constitutional: No fever/chills Eyes: No visual changes. ENT: No sore throat. Cardiovascular: Denies chest pain. Respiratory: Denies shortness of breath. Gastrointestinal:  No nausea, no vomiting.  No diarrhea.  No constipation. Genitourinary: Negative for dysuria. Musculoskeletal: Negative for back pain. Skin: Negative for rash. Neurological: Negative for headaches, focal weakness or numbness.   ____________________________________________   PHYSICAL EXAM:  VITAL SIGNS: ED Triage Vitals  Enc Vitals Group     BP 10/28/17 1851 109/76     Pulse Rate 10/28/17 1851 94     Resp 10/28/17 1851 18     Temp 10/28/17  1851 98 F (36.7 C)     Temp Source 10/28/17 1851 Oral     SpO2 10/28/17 1851 100 %     Weight 10/28/17 1855 160 lb (72.6 kg)     Height 10/28/17 1855 5\' 5"  (1.651 m)     Head Circumference --      Peak Flow --      Pain Score 10/28/17 1855 4     Pain Loc --      Pain Edu? --      Excl. in GC? --    Constitutional: Alert and oriented. Well appearing and in no acute distress. Eyes: Conjunctivae are normal.  Head: Atraumatic. Nose: No congestion/rhinnorhea. Mouth/Throat: Mucous membranes are moist.  Neck: No stridor.   Cardiovascular: Normal rate, regular rhythm. Grossly normal heart sounds.   Respiratory: Normal respiratory effort.  No retractions. Lungs CTAB. Gastrointestinal: Soft tissue mass palpable just about 1 inch to the lateral superior border of the left side of the incision.  The mass is about an inch and a half in diameter and is soft and mobile.  However it cannot be reduced.  There is no overlying erythema, induration.  The rest of the abdomen is soft and nontender.  The incision itself appears well-healed without signs of infection.   No distention.  Musculoskeletal: No lower extremity tenderness nor edema.  No joint effusions. Neurologic:  Normal speech and language. No gross focal neurologic deficits are appreciated. Skin:  Skin is warm, dry and intact. No rash noted. Psychiatric: Mood and affect are normal. Speech and behavior are normal.  ____________________________________________   LABS (all labs ordered are listed, but only abnormal results are displayed)  Labs Reviewed  CBC - Abnormal; Notable for the following components:      Result Value   RBC 5.22 (*)    All other components within normal limits  URINALYSIS, COMPLETE (UACMP) WITH MICROSCOPIC - Abnormal; Notable for the following components:   Color, Urine YELLOW (*)    APPearance CLEAR (*)    Squamous Epithelial / LPF 0-5 (*)    All other components within normal limits  COMPREHENSIVE METABOLIC  PANEL  POC URINE PREG, ED  POCT PREGNANCY, URINE   ____________________________________________  EKG   ____________________________________________  RADIOLOGY   ____________________________________________   PROCEDURES  Procedure(s) performed:   Procedures  Critical Care performed:   ____________________________________________   INITIAL IMPRESSION / ASSESSMENT AND PLAN / ED COURSE  Pertinent labs & imaging results that were available during my care of the patient were reviewed by me and considered in my medical decision making (see chart for details).  Differential diagnosis includes, but is not limited to, ovarian cyst, ovarian torsion, acute appendicitis, diverticulitis, urinary tract infection/pyelonephritis, endometriosis, bowel obstruction, colitis, renal colic, gastroenteritis, hernia, fibroids, endometriosis, pregnancy related pain including ectopic pregnancy, etc. Hernia, secondary endometriosis  As part of my medical decision making, I reviewed the following data within the electronic MEDICAL RECORD NUMBER Old chart reviewed  Unlikely to be incarcerated hernia.  Very reassuring lab work.  Patient without any difficulty with GI symptoms after 1 year.  I recommend that she follow-up with her OB/GYN for further consultation secondary to her choice of birth control likely requiring a lengthy discussion with her OB/GYN.  She is understanding of this plan willing to comply.  Will be discharged at this time.         ____________________________________________   FINAL CLINICAL IMPRESSION(S) / ED DIAGNOSES  Abdominal pain.  Abdominal mass.    NEW MEDICATIONS STARTED DURING THIS VISIT:  This SmartLink is deprecated. Use AVSMEDLIST instead to display the medication list for a patient.   Note:  This document was prepared using Dragon voice recognition software and may include unintentional dictation errors.     Myrna BlazerSchaevitz, David Matthew, MD 10/28/17 2040

## 2017-11-03 ENCOUNTER — Encounter: Payer: Medicaid Other | Admitting: Obstetrics and Gynecology

## 2017-11-04 ENCOUNTER — Encounter: Payer: Self-pay | Admitting: Obstetrics and Gynecology

## 2017-11-04 ENCOUNTER — Telehealth: Payer: Self-pay | Admitting: Obstetrics and Gynecology

## 2017-11-04 ENCOUNTER — Ambulatory Visit (INDEPENDENT_AMBULATORY_CARE_PROVIDER_SITE_OTHER): Payer: Medicaid Other | Admitting: Obstetrics and Gynecology

## 2017-11-04 VITALS — BP 102/66 | HR 86 | Ht 65.0 in | Wt 158.4 lb

## 2017-11-04 DIAGNOSIS — R1032 Left lower quadrant pain: Secondary | ICD-10-CM

## 2017-11-04 NOTE — Addendum Note (Signed)
Addended by: Elonda HuskyEVANS, Dock Baccam J on: 11/04/2017 04:39 PM   Modules accepted: Orders

## 2017-11-04 NOTE — Telephone Encounter (Signed)
I dont see any referral entered for [facility].

## 2017-11-04 NOTE — Progress Notes (Signed)
HPI:      [name] is a 31 y.o. [id number] who LMP was Patient's last menstrual period was 10/30/2017 (exact date).  Subjective:   She presents today continuing to complain of left lower quadrant pain associated with a small mass above her cesarean scar.  She states that she has not started OCPs as directed because she has concerns of stroke because her mother had a stroke at a young age on OCPs.  The patient has no idea whether or not they have a family history of clotting disorder. Was recently seen in the emergency department for this same issue. When discussing her cycles are irregular bleeding and vaginal discharge she redirected the conversation stating that "my main problem is this knot causing the pain." Patient has a tubal ligation for birth control    Hx: The following portions of the patient's history were reviewed and updated as appropriate:             She  has a past medical history of Heart murmur and Miscarriage. She does not have any pertinent problems on file. She  has a past surgical history that includes Cesarean section; Colonoscopy (31 years old); Cesarean section (N/A, 02/28/2016); and Tubal ligation (Bilateral, 02/28/2016). Her family history includes Cancer in her paternal grandfather; Stroke in her mother. She  reports that she has been smoking cigarettes.  She has been smoking about 0.50 packs per day. she has never used smokeless tobacco. She reports that she does not drink alcohol or use drugs. She is allergic to cephalexin; sulfa antibiotics; and sulfur.       Review of Systems:  Review of Systems  Constitutional: Denied constitutional symptoms, night sweats, recent illness, fatigue, fever, insomnia and weight loss.  Eyes: Denied eye symptoms, eye pain, photophobia, vision change and visual disturbance.  Ears/Nose/Throat/Neck: Denied ear, nose, throat or neck symptoms, hearing loss, nasal discharge, sinus congestion and sore throat.  Cardiovascular: Denied  cardiovascular symptoms, arrhythmia, chest pain/pressure, edema, exercise intolerance, orthopnea and palpitations.  Respiratory: Denied pulmonary symptoms, asthma, pleuritic pain, productive sputum, cough, dyspnea and wheezing.  Gastrointestinal: Denied, gastro-esophageal reflux, melena, nausea and vomiting.  Genitourinary: Denied genitourinary symptoms including symptomatic vaginal discharge, pelvic relaxation issues, and urinary complaints.  Musculoskeletal: See HPI for additional information.  Dermatologic: Denied dermatology symptoms, rash and scar.  Neurologic: Denied neurology symptoms, dizziness, headache, neck pain and syncope.  Psychiatric: Denied psychiatric symptoms, anxiety and depression.  Endocrine: Denied endocrine symptoms including hot flashes and night sweats.   Meds:   No current outpatient medications on file prior to visit.   No current facility-administered medications on file prior to visit.     Objective:     Vitals:   11/04/17 0935  BP: 102/66  Pulse: 86              Abdominal examination reveals a small painful area above her cesarean scar in the left lower quadrant.  Pain is localized to this area.  Pain is worse with palpation during Valsalva.  Assessment:    [id number] Patient Active Problem List   Diagnosis Date Noted  . Postoperative state 02/28/2016  . Labor and delivery indication for care or intervention 01/25/2016  . Decreased fetal movement 10/27/2015  . 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 History of poor fetal growth 08/31/2015     1. Left lower quadrant pain     Abdominal wall mass-likely small hernia versus neuroma versus endometriosis in cesarean scar.   Plan:  1.  Refer to general surgery for workup and surgical excision. Orders No orders of the defined types were placed in this encounter.   No orders of the defined types were placed in this encounter.     F/U  Return for Annual Physical. I spent 18 minutes with  this patient of which greater than 50% was spent discussing her abdominal pain, strategies for medical versus surgical treatment, risk benefits for both, regular cycles and vaginal discharge.  Elonda Huskyavid J. Evans, M.D. 11/04/2017 10:11 AM

## 2017-11-04 NOTE — Telephone Encounter (Signed)
The patient had an appointment today and was told to call [facility] and when the patient called she was informed that a referral needed to be in and the referring office would call her back. No other information was disclosed. Please advise.

## 2017-11-04 NOTE — Telephone Encounter (Signed)
Pt informed

## 2017-11-05 ENCOUNTER — Encounter: Payer: Self-pay | Admitting: General Surgery

## 2017-11-07 ENCOUNTER — Encounter: Payer: Self-pay | Admitting: *Deleted

## 2017-11-12 ENCOUNTER — Encounter: Payer: Self-pay | Admitting: General Surgery

## 2017-11-12 ENCOUNTER — Other Ambulatory Visit: Payer: Self-pay

## 2017-11-12 ENCOUNTER — Encounter: Payer: Self-pay | Admitting: *Deleted

## 2017-11-12 ENCOUNTER — Inpatient Hospital Stay: Payer: Self-pay

## 2017-11-12 ENCOUNTER — Encounter
Admission: RE | Admit: 2017-11-12 | Discharge: 2017-11-12 | Disposition: A | Discharge status: Home / Self Care | Payer: Medicaid Other | Source: Ambulatory Visit | Attending: General Surgery | Admitting: General Surgery

## 2017-11-12 ENCOUNTER — Ambulatory Visit: Payer: Medicaid Other | Admitting: General Surgery

## 2017-11-12 VITALS — BP 120/80 | HR 70 | Resp 12 | Ht 65.0 in | Wt 155.0 lb

## 2017-11-12 DIAGNOSIS — R1904 Left lower quadrant abdominal swelling, mass and lump: Secondary | ICD-10-CM

## 2017-11-12 NOTE — Patient Instructions (Signed)
Your procedure is scheduled on: 11/17/17 Mon Report to Same Day Surgery 2nd floor medical mall [facility](Medical Mall Entrance-take elevator on left to 2nd floor.  Check in with surgery information desk.) To find out your arrival time please call [phone] between 1PM - 3PM on 11/14/17 Fri  Remember: Instructions that are not followed completely may result in serious medical risk, up to and including death, or upon the discretion of your surgeon and anesthesiologist your surgery may need to be rescheduled.    _x___ 1. Do not eat food after midnight the night before your procedure. You may drink clear liquids up to 2 hours before you are scheduled to arrive at the hospital for your procedure.  Do not drink clear liquids within 2 hours of your scheduled arrival to the hospital.  Clear liquids include  --Water or Apple juice without pulp  --Clear carbohydrate beverage such as ClearFast or Gatorade  --Black Coffee or Clear Tea (No milk, no creamers, do not add anything to                  the coffee or Tea Type 1 and type 2 diabetics should only drink water.  No gum chewing or hard candies.     __x__ 2. No Alcohol for 24 hours before or after surgery.   __x__3. No Smoking for 24 prior to surgery.   ____  4. Bring all medications with you on the day of surgery if instructed.    __x__ 5. Notify your doctor if there is any change in your medical condition     (cold, fever, infections).     Do not wear jewelry, make-up, hairpins, clips or nail polish.  Do not wear lotions, powders, or perfumes. You may wear deodorant.  Do not shave 48 hours prior to surgery. Men may shave face and neck.  Do not bring valuables to the hospital.    [facility] is not responsible for any belongings or valuables.               Contacts, dentures or bridgework may not be worn into surgery.  Leave your suitcase in the car. After surgery it may be brought to your room.  For patients admitted to the hospital, discharge  time is determined by your                       treatment team.   Patients discharged the day of surgery will not be allowed to drive home.  You will need someone to drive you home and stay with you the night of your procedure.    Please read over the following fact sheets that you were given:   [facility] Preparing for Surgery and or MRSA Information   _x___ Take anti-hypertensive listed below, cardiac, seizure, asthma,     anti-reflux and psychiatric medicines. These include:  1. None  2.  3.  4.  5.  6.  ____Fleets enema or Magnesium Citrate as directed.   ____ Use CHG Soap or sage wipes as directed on instruction sheet   ____ Use inhalers on the day of surgery and bring to hospital day of surgery  ____ Stop Metformin and Janumet 2 days prior to surgery.    ____ Take 1/2 of usual insulin dose the night before surgery and none on the morning     surgery.   _x___ Follow recommendations from Cardiologist, Pulmonologist or PCP regarding  stopping Aspirin, Coumadin, Plavix ,Eliquis, Effient, or Pradaxa, and Pletal.  X____Stop Anti-inflammatories such as Advil, Aleve, Ibuprofen, Motrin, Naproxen, Naprosyn, Goodies powders or aspirin products. OK to take Tylenol and                          Celebrex.   _x___ Stop supplements until after surgery.  But may continue Vitamin D, Vitamin B,       and multivitamin.   ____ Bring C-Pap to the hospital.

## 2017-11-12 NOTE — Patient Instructions (Addendum)
The patient is aware to call back for any questions or concerns.  Endometrioma   excision of mass under SDS

## 2017-11-12 NOTE — Progress Notes (Addendum)
Patient ID: Arlington CalixMisti L Ho, female   DOB: 1986/06/03, 31 y.o.   MRN: [id number]  Chief Complaint  Patient presents with  . Abdominal Pain  . Mass    HPI Amarra L Broadus JohnWarren is a 31 y.o. female.  Here for evaluation of a constant left lower quadrant pain referred by Dr Brennan Baileyavid Evans. She states she has noticed the pain and a knot for at least a year. She states the not has gotten larger over the past year. She states it is worse with her menstrual period.   HPI  Past Medical History:  Diagnosis Date  . Abdominal mass   . Dysrhythmia   . Heart murmur    Congential heart murmur  . Miscarriage     Past Surgical History:  Procedure Laterality Date  . CESAREAN SECTION N/A 02/28/2016   Procedure: CESAREAN SECTION;  Surgeon: Suzy Bouchardhomas J Schermerhorn, MD;  Location: [facility] ORS;  Service: Obstetrics;  Laterality: N/A;  . CESAREAN SECTION  2010  . COLONOSCOPY  [age] years old  . TUBAL LIGATION Bilateral 02/28/2016   Procedure: BILATERAL TUBAL LIGATION;  Surgeon: Suzy Bouchardhomas J Schermerhorn, MD;  Location: [facility] ORS;  Service: Obstetrics;  Laterality: Bilateral;  . WISDOM TOOTH EXTRACTION      Family History  Problem Relation Age of Onset  . Stroke Mother   . Cancer Paternal Grandfather     Social History Social History   Tobacco Use  . Smoking status: Current Every Day Smoker    Packs/day: 0.50    Years: 10.00    Pack years: 5.00    Types: Cigarettes  . Smokeless tobacco: Never Used  Substance Use Topics  . Alcohol use: No  . Drug use: No    Allergies  Allergen Reactions  . Cephalexin Other (See Comments)    Unknown  . Sulfa Antibiotics Hives  . Sulfur Hives    No current facility-administered medications for this visit.    No current outpatient medications on file.   Facility-Administered Medications Ordered in Other Visits  Medication Dose Route Frequency Provider Last Rate Last Dose  . Chlorhexidine Gluconate Cloth 2 % PADS 6 each  6 each Topical Once Sankar, Seeplaputhur G, MD       . famotidine (PEPCID) tablet 20 mg  20 mg Oral Once Naomie DeanKephart, William K, MD        Review of Systems Review of Systems  Constitutional: Negative.   Respiratory: Positive for cough.   Cardiovascular: Negative.   Gastrointestinal: Positive for abdominal pain. Negative for constipation, diarrhea, nausea and vomiting.    Blood pressure 120/80, pulse 70, resp. rate 12, height 5\' 5"  (1.651 m), weight 155 lb (70.3 kg), last menstrual period 10/30/2017.  Physical Exam Physical Exam  Constitutional: She is oriented to person, place, and time. She appears well-developed and well-nourished.  HENT:  Mouth/Throat: Oropharynx is clear and moist.  Eyes: Conjunctivae are normal. No scleral icterus.  Neck: Neck supple.  Cardiovascular: Normal rate, regular rhythm and normal heart sounds.  Pulmonary/Chest: Effort normal and breath sounds normal.  Abdominal: Soft. Normal appearance and bowel sounds are normal.    Lymphadenopathy:    She has no cervical adenopathy.  Neurological: She is alert and oriented to person, place, and time.  Skin: Skin is warm and dry.  Psychiatric: Her behavior is normal.    Data Reviewed Prior notes Limited abdominal ultrasound preformed.-Targeted over palpable mass. There is a 1.9cm size poorly marginated mass with mixed hypo and hyper echogenicity. This lies on top of  the fascia.   Assessment     Endometrioma - LLQ mass .    Plan    Discussed in detail options, pt wishes to proceed with excision of LLQ mass in SDS.      HPI, Physical Exam, Assessment and Plan have been scribed under the direction and in the presence of S. G. Sankar, MD Aldine Grainger, RN  I have completed the exam and reviewed the above documentation for accuracy and completeness.  I agree with the above.  [employer] has been used and any errors in dictation or transcription are unintentional.  Seeplaputhur G. Sankar, M.D., F.A.C.S.  SANKAR,SEEPLAPUTHUR G 11/19/2017, 11:07  AM  Patient's surgery has been scheduled for 11-17-17 at [facility].  Michele J. Bailey, CMA   

## 2017-11-12 NOTE — H&P (View-Only) (Signed)
Patient ID: Arlington CalixMisti L Ho, female   DOB: 1986/06/03, 31 y.o.   MRN: [id number]  Chief Complaint  Patient presents with  . Abdominal Pain  . Mass    HPI Amarra L Broadus JohnWarren is a 31 y.o. female.  Here for evaluation of a constant left lower quadrant pain referred by Dr Brennan Baileyavid Evans. She states she has noticed the pain and a knot for at least a year. She states the not has gotten larger over the past year. She states it is worse with her menstrual period.   HPI  Past Medical History:  Diagnosis Date  . Abdominal mass   . Dysrhythmia   . Heart murmur    Congential heart murmur  . Miscarriage     Past Surgical History:  Procedure Laterality Date  . CESAREAN SECTION N/A 02/28/2016   Procedure: CESAREAN SECTION;  Surgeon: Suzy Bouchardhomas J Schermerhorn, MD;  Location: [facility] ORS;  Service: Obstetrics;  Laterality: N/A;  . CESAREAN SECTION  2010  . COLONOSCOPY  [age] years old  . TUBAL LIGATION Bilateral 02/28/2016   Procedure: BILATERAL TUBAL LIGATION;  Surgeon: Suzy Bouchardhomas J Schermerhorn, MD;  Location: [facility] ORS;  Service: Obstetrics;  Laterality: Bilateral;  . WISDOM TOOTH EXTRACTION      Family History  Problem Relation Age of Onset  . Stroke Mother   . Cancer Paternal Grandfather     Social History Social History   Tobacco Use  . Smoking status: Current Every Day Smoker    Packs/day: 0.50    Years: 10.00    Pack years: 5.00    Types: Cigarettes  . Smokeless tobacco: Never Used  Substance Use Topics  . Alcohol use: No  . Drug use: No    Allergies  Allergen Reactions  . Cephalexin Other (See Comments)    Unknown  . Sulfa Antibiotics Hives  . Sulfur Hives    No current facility-administered medications for this visit.    No current outpatient medications on file.   Facility-Administered Medications Ordered in Other Visits  Medication Dose Route Frequency Provider Last Rate Last Dose  . Chlorhexidine Gluconate Cloth 2 % PADS 6 each  6 each Topical Once Tocarra Gassen G, MD       . famotidine (PEPCID) tablet 20 mg  20 mg Oral Once Naomie DeanKephart, William K, MD        Review of Systems Review of Systems  Constitutional: Negative.   Respiratory: Positive for cough.   Cardiovascular: Negative.   Gastrointestinal: Positive for abdominal pain. Negative for constipation, diarrhea, nausea and vomiting.    Blood pressure 120/80, pulse 70, resp. rate 12, height 5\' 5"  (1.651 m), weight 155 lb (70.3 kg), last menstrual period 10/30/2017.  Physical Exam Physical Exam  Constitutional: She is oriented to person, place, and time. She appears well-developed and well-nourished.  HENT:  Mouth/Throat: Oropharynx is clear and moist.  Eyes: Conjunctivae are normal. No scleral icterus.  Neck: Neck supple.  Cardiovascular: Normal rate, regular rhythm and normal heart sounds.  Pulmonary/Chest: Effort normal and breath sounds normal.  Abdominal: Soft. Normal appearance and bowel sounds are normal.    Lymphadenopathy:    She has no cervical adenopathy.  Neurological: She is alert and oriented to person, place, and time.  Skin: Skin is warm and dry.  Psychiatric: Her behavior is normal.    Data Reviewed Prior notes Limited abdominal ultrasound preformed.-Targeted over palpable mass. There is a 1.9cm size poorly marginated mass with mixed hypo and hyper echogenicity. This lies on top of  the fascia.   Assessment     Endometrioma - LLQ mass .    Plan    Discussed in detail options, pt wishes to proceed with excision of LLQ mass in SDS.      HPI, Physical Exam, Assessment and Plan have been scribed under the direction and in the presence of Kathreen CosierS. G. Demetri Kerman, MD Dorathy DaftMarsha Hatch, RN  I have completed the exam and reviewed the above documentation for accuracy and completeness.  I agree with the above.  Museum[employer]gallery conservator[employer]agon [employer] has been used and any errors in dictation or transcription are unintentional.  Osby Sweetin G. Evette CristalSankar, M.D., F.A.C.S.  Gerlene BurdockSANKAR,Jordis Repetto G 11/19/2017, 11:07  AM  [facility]  Nicholes MangoMichele J. Bailey, CMA

## 2017-11-14 ENCOUNTER — Telehealth: Payer: Self-pay

## 2017-11-14 NOTE — Telephone Encounter (Signed)
Spoke with patient and she would like to reschedule her surgery to [facility]. OR notified of reschedule.

## 2017-11-14 NOTE — Telephone Encounter (Signed)
Message left for the patient to confirm her for surgery on 11/17/17.

## 2017-11-19 ENCOUNTER — Other Ambulatory Visit: Payer: Self-pay

## 2017-11-19 ENCOUNTER — Encounter
Admission: RE | Disposition: A | Discharge status: Home / Self Care | Payer: Self-pay | Source: Ambulatory Visit | Attending: General Surgery

## 2017-11-19 ENCOUNTER — Ambulatory Visit: Payer: Medicaid Other | Admitting: Anesthesiology

## 2017-11-19 ENCOUNTER — Ambulatory Visit
Admission: RE | Admit: 2017-11-19 | Discharge: 2017-11-19 | Disposition: A | Discharge status: Home / Self Care | Payer: Medicaid Other | Source: Ambulatory Visit | Attending: General Surgery | Admitting: General Surgery

## 2017-11-19 DIAGNOSIS — N809 Endometriosis, unspecified: Secondary | ICD-10-CM | POA: Insufficient documentation

## 2017-11-19 DIAGNOSIS — F17210 Nicotine dependence, cigarettes, uncomplicated: Secondary | ICD-10-CM | POA: Diagnosis not present

## 2017-11-19 DIAGNOSIS — R1904 Left lower quadrant abdominal swelling, mass and lump: Secondary | ICD-10-CM | POA: Diagnosis not present

## 2017-11-19 HISTORY — DX: Intra-abdominal and pelvic swelling, mass and lump, unspecified site: R19.00

## 2017-11-19 HISTORY — DX: Cardiac arrhythmia, unspecified: I49.9

## 2017-11-19 HISTORY — PX: EXCISION OF ABDOMINAL WALL TUMOR: SHX6687

## 2017-11-19 LAB — POCT PREGNANCY, URINE: PREG TEST UR: NEGATIVE

## 2017-11-19 SURGERY — EXCISION, NEOPLASM, ABDOMINAL WALL
Anesthesia: General | Wound class: Clean

## 2017-11-19 MED ORDER — ACETAMINOPHEN 10 MG/ML IV SOLN
INTRAVENOUS | Status: DC | PRN
  Administered 2017-11-19: 1000 mg via INTRAVENOUS

## 2017-11-19 MED ORDER — SUGAMMADEX SODIUM 200 MG/2ML IV SOLN
INTRAVENOUS | Status: AC
  Filled 2017-11-19: qty 2

## 2017-11-19 MED ORDER — HYDROCODONE-ACETAMINOPHEN 5-325 MG PO TABS: 1.0000 | ORAL_TABLET | ORAL | 0 refills | Status: AC | PRN

## 2017-11-19 MED ORDER — FENTANYL CITRATE (PF) 100 MCG/2ML IJ SOLN
INTRAMUSCULAR | Status: AC
  Filled 2017-11-19: qty 2

## 2017-11-19 MED ORDER — DEXAMETHASONE SODIUM PHOSPHATE 10 MG/ML IJ SOLN
INTRAMUSCULAR | Status: AC
  Filled 2017-11-19: qty 1

## 2017-11-19 MED ORDER — ONDANSETRON HCL 4 MG/2ML IJ SOLN
INTRAMUSCULAR | Status: DC | PRN
  Administered 2017-11-19: 4 mg via INTRAVENOUS

## 2017-11-19 MED ORDER — PROPOFOL 10 MG/ML IV BOLUS
INTRAVENOUS | Status: DC | PRN
  Administered 2017-11-19: 150 mg via INTRAVENOUS

## 2017-11-19 MED ORDER — BUPIVACAINE-EPINEPHRINE (PF) 0.5% -1:200000 IJ SOLN
INTRAMUSCULAR | Status: AC
  Filled 2017-11-19: qty 30

## 2017-11-19 MED ORDER — ACETAMINOPHEN NICU IV SYRINGE 10 MG/ML
INTRAVENOUS | Status: AC
  Filled 2017-11-19: qty 1

## 2017-11-19 MED ORDER — LIDOCAINE HCL (CARDIAC) 20 MG/ML IV SOLN
INTRAVENOUS | Status: DC | PRN
  Administered 2017-11-19: 100 mg via INTRAVENOUS

## 2017-11-19 MED ORDER — ONDANSETRON HCL 4 MG/2ML IJ SOLN: 4.0000 mg | Freq: Once | INTRAMUSCULAR | Status: DC | PRN

## 2017-11-19 MED ORDER — MIDAZOLAM HCL 2 MG/2ML IJ SOLN
INTRAMUSCULAR | Status: AC
  Filled 2017-11-19: qty 2

## 2017-11-19 MED ORDER — PROPOFOL 10 MG/ML IV BOLUS
INTRAVENOUS | Status: AC
  Filled 2017-11-19: qty 20

## 2017-11-19 MED ORDER — DEXAMETHASONE SODIUM PHOSPHATE 10 MG/ML IJ SOLN
INTRAMUSCULAR | Status: DC | PRN
  Administered 2017-11-19: 10 mg via INTRAVENOUS

## 2017-11-19 MED ORDER — FENTANYL CITRATE (PF) 100 MCG/2ML IJ SOLN
INTRAMUSCULAR | Status: AC
  Administered 2017-11-19: 25 ug via INTRAVENOUS
  Filled 2017-11-19: qty 2

## 2017-11-19 MED ORDER — FENTANYL CITRATE (PF) 100 MCG/2ML IJ SOLN
INTRAMUSCULAR | Status: DC | PRN
  Administered 2017-11-19: 100 ug via INTRAVENOUS

## 2017-11-19 MED ORDER — ROCURONIUM BROMIDE 50 MG/5ML IV SOLN
INTRAVENOUS | Status: AC
  Filled 2017-11-19: qty 1

## 2017-11-19 MED ORDER — LACTATED RINGERS IV SOLN
INTRAVENOUS | Status: DC
  Administered 2017-11-19: 11:00:00 via INTRAVENOUS

## 2017-11-19 MED ORDER — ONDANSETRON HCL 4 MG/2ML IJ SOLN
INTRAMUSCULAR | Status: AC
  Filled 2017-11-19: qty 2

## 2017-11-19 MED ORDER — CHLORHEXIDINE GLUCONATE CLOTH 2 % EX PADS: 6.0000 | MEDICATED_PAD | Freq: Once | CUTANEOUS | Status: DC

## 2017-11-19 MED ORDER — SUCCINYLCHOLINE CHLORIDE 20 MG/ML IJ SOLN
INTRAMUSCULAR | Status: AC
  Filled 2017-11-19: qty 1

## 2017-11-19 MED ORDER — LIDOCAINE HCL (PF) 2 % IJ SOLN
INTRAMUSCULAR | Status: AC
  Filled 2017-11-19: qty 10

## 2017-11-19 MED ORDER — FENTANYL CITRATE (PF) 100 MCG/2ML IJ SOLN
25.0000 ug | INTRAMUSCULAR | Status: DC | PRN
  Administered 2017-11-19 (×4): 25 ug via INTRAVENOUS

## 2017-11-19 MED ORDER — SUCCINYLCHOLINE CHLORIDE 20 MG/ML IJ SOLN
INTRAMUSCULAR | Status: DC | PRN
  Administered 2017-11-19: 100 mg via INTRAVENOUS

## 2017-11-19 MED ORDER — BUPIVACAINE-EPINEPHRINE 0.5% -1:200000 IJ SOLN
INTRAMUSCULAR | Status: DC | PRN
  Administered 2017-11-19: 20 mL

## 2017-11-19 MED ORDER — SUGAMMADEX SODIUM 200 MG/2ML IV SOLN
INTRAVENOUS | Status: DC | PRN
  Administered 2017-11-19: 140.6 mg via INTRAVENOUS

## 2017-11-19 MED ORDER — KETOROLAC TROMETHAMINE 30 MG/ML IJ SOLN
INTRAMUSCULAR | Status: AC
  Filled 2017-11-19: qty 1

## 2017-11-19 MED ORDER — MIDAZOLAM HCL 2 MG/2ML IJ SOLN
INTRAMUSCULAR | Status: DC | PRN
  Administered 2017-11-19: 2 mg via INTRAVENOUS

## 2017-11-19 MED ORDER — ROCURONIUM BROMIDE 100 MG/10ML IV SOLN
INTRAVENOUS | Status: DC | PRN
  Administered 2017-11-19: 40 mg via INTRAVENOUS
  Administered 2017-11-19: 10 mg via INTRAVENOUS

## 2017-11-19 MED ORDER — ESMOLOL HCL 100 MG/10ML IV SOLN
INTRAVENOUS | Status: AC
  Filled 2017-11-19: qty 10

## 2017-11-19 MED ORDER — KETOROLAC TROMETHAMINE 30 MG/ML IJ SOLN
INTRAMUSCULAR | Status: DC | PRN
  Administered 2017-11-19: 30 mg via INTRAVENOUS

## 2017-11-19 MED ORDER — FAMOTIDINE 20 MG PO TABS: 20.0000 mg | ORAL_TABLET | Freq: Once | ORAL | Status: DC

## 2017-11-19 MED ORDER — ESMOLOL HCL 100 MG/10ML IV SOLN
INTRAVENOUS | Status: DC | PRN
  Administered 2017-11-19: 10 mg via INTRAVENOUS

## 2017-11-19 SURGICAL SUPPLY — 20 items
BLADE SURG 15 STRL LF DISP TIS (BLADE) ×1 IMPLANT
BLADE SURG 15 STRL SS (BLADE) ×2
CANISTER SUCT 1200ML W/VALVE (MISCELLANEOUS) ×3 IMPLANT
CHLORAPREP W/TINT 26ML (MISCELLANEOUS) ×3 IMPLANT
DERMABOND ADVANCED (GAUZE/BANDAGES/DRESSINGS) ×2
DERMABOND ADVANCED .7 DNX12 (GAUZE/BANDAGES/DRESSINGS) ×1 IMPLANT
DRAPE LAPAROTOMY T 102X78X121 (DRAPES) ×3 IMPLANT
ELECT REM PT RETURN 9FT ADLT (ELECTROSURGICAL) ×3
ELECTRODE REM PT RTRN 9FT ADLT (ELECTROSURGICAL) ×1 IMPLANT
GLOVE BIO SURGEON STRL SZ7.5 (GLOVE) ×9 IMPLANT
GLOVE INDICATOR 8.0 STRL GRN (GLOVE) ×3 IMPLANT
GOWN STRL REUS W/ TWL LRG LVL3 (GOWN DISPOSABLE) ×2 IMPLANT
GOWN STRL REUS W/TWL LRG LVL3 (GOWN DISPOSABLE) ×4
KIT RM TURNOVER STRD PROC AR (KITS) ×3 IMPLANT
NEEDLE HYPO 22GX1.5 SAFETY (NEEDLE) ×3 IMPLANT
PACK BASIN MINOR ARMC (MISCELLANEOUS) ×3 IMPLANT
SUT MNCRL AB 4-0 PS2 18 (SUTURE) ×3 IMPLANT
SUT VIC AB 2-0 SH 27 (SUTURE) ×2
SUT VIC AB 2-0 SH 27XBRD (SUTURE) ×1 IMPLANT
SYR 10ML LL (SYRINGE) ×3 IMPLANT

## 2017-11-19 NOTE — Anesthesia Postprocedure Evaluation (Signed)
Anesthesia Post Note  Patient: Katelyn Jefferson  Procedure(s) Performed: EXCISION OF ABDOMINAL WALL MASS (N/A )  Patient location during evaluation: PACU Anesthesia Type: General Level of consciousness: awake and alert and oriented Pain management: pain level controlled Vital Signs Assessment: post-procedure vital signs reviewed and stable Respiratory status: spontaneous breathing Cardiovascular status: blood pressure returned to baseline Anesthetic complications: no     Last Vitals:  Vitals:   11/19/17 1437 11/19/17 1508  BP: 117/72 104/61  Pulse: 80 73  Resp: 16 16  Temp: 37.2 C   SpO2: 99% 97%    Last Pain:  Vitals:   11/19/17 1508  TempSrc:   PainSc: 1                  Glendoris Nodarse

## 2017-11-19 NOTE — Progress Notes (Signed)
Ice pack to incision 

## 2017-11-19 NOTE — Anesthesia Post-op Follow-up Note (Signed)
Anesthesia QCDR form completed.        

## 2017-11-19 NOTE — Discharge Instructions (Signed)
AMBULATORY SURGERY  °DISCHARGE INSTRUCTIONS ° ° °1) The drugs that you were given will stay in your system until tomorrow so for the next 24 hours you should not: ° °A) Drive an automobile °B) Make any legal decisions °C) Drink any alcoholic beverage ° ° °2) You may resume regular meals tomorrow.  Today it is better to start with liquids and gradually work up to solid foods. ° °You may eat anything you prefer, but it is better to start with liquids, then soup and crackers, and gradually work up to solid foods. ° ° °3) Please notify your doctor immediately if you have any unusual bleeding, trouble breathing, redness and pain at the surgery site, drainage, fever, or pain not relieved by medication. ° ° ° °4) Additional Instructions: ° ° ° ° ° ° ° °Please contact your physician with any problems or Same Day Surgery at [phone], Monday through Friday 6 am to 4 pm, or [place] at [place] Main number at [phone]. °

## 2017-11-19 NOTE — Op Note (Signed)
Preop diagnosis: Abdominal wall mass  Post op diagnosis: Same.  Subfascial,  suspected endometrioma  Operation: Excision of abdominal wall mass from the subfascial level  Surgeon: Kathreen CosierS.  G.  Sankar  Assistant:     Anesthesia: General  Complications: None  EBL: Minimal  Drains: None  Description: Patient was put to sleep with an endotracheal tube in the lower abdomen prepped and draped sterile field.  Location of this mass in the abdominal wall was marked preoperatively.  This was again palpated with the patient now lying supine and asleep.  The surrounding area was infiltrated with a 0.5% Marcaine   with epi-total of 30 mL used.  Mass was located about 2-2-1/2 cm above the previous C-section incision and by history was consistent with a suspected endometrioma.  A transverse skin incision was made overlying the subcutaneous tissue and the Scarpa's fascia.  It appeared the mass was located just below the outer fascia which was opened.  The mass was in fact involving us some of the underlying muscle beneath the fascia and some scar tissue associated with the previous closure.  The mass was approximately 2-1/2 cm in diameter this was circumferentially freed with cautery and removed.  This was sent in formalin to pathology.  To ensure hemostasis the fascia was reapproximated with 3 figure-of-eight stitches of 0 Prolene.  Scarpa's fascia was closed with running 2-0 Vicryl.  Skin closed with subcuticular 4-0 Monocryl covered with Dermabond.  Procedure was well-tolerated and patient subsequently returned to recovery room after extubation in stable condition.

## 2017-11-19 NOTE — Anesthesia Procedure Notes (Signed)
Procedure Name: Intubation Date/Time: 11/19/2017 12:31 PM Performed by: Nelda Marseille, CRNA Pre-anesthesia Checklist: Patient identified, Patient being monitored, Timeout performed, Emergency Drugs available and Suction available Patient Re-evaluated:Patient Re-evaluated prior to induction Oxygen Delivery Method: Circle system utilized Preoxygenation: Pre-oxygenation with 100% oxygen Induction Type: IV induction Ventilation: Mask ventilation without difficulty Laryngoscope Size: Mac and 3 Grade View: Grade I Tube type: Oral Tube size: 7.0 mm Number of attempts: 1 Airway Equipment and Method: Stylet Placement Confirmation: ETT inserted through vocal cords under direct vision,  positive ETCO2 and breath sounds checked- equal and bilateral Secured at: 21 cm Tube secured with: Tape Dental Injury: Teeth and Oropharynx as per pre-operative assessment

## 2017-11-19 NOTE — Interval H&P Note (Signed)
History and Physical Interval Note:  11/19/2017 12:13 PM  Katelyn Jefferson Katelyn Jefferson  has presented today for surgery, with the diagnosis of left lower quadrant abdominal wall mass  The various methods of treatment have been discussed with the patient and family. After consideration of risks, benefits and other options for treatment, the patient has consented to  Procedure(s): EXCISION OF ABDOMINAL WALL MASS (N/A) as a surgical intervention .  The patient's history has been reviewed, patient examined, no change in status, stable for surgery.  I have reviewed the patient's chart and labs.  Questions were answered to the patient's satisfaction.     Gillian Meeuwsen G

## 2017-11-19 NOTE — Transfer of Care (Signed)
Immediate Anesthesia Transfer of Care Note  Patient: Katelyn Jefferson  Procedure(s) Performed: EXCISION OF ABDOMINAL WALL MASS (N/A )  Patient Location: PACU  Anesthesia Type:General  Level of Consciousness: awake and sedated  Airway & Oxygen Therapy: Patient Spontanous Breathing and Patient connected to face mask oxygen  Post-op Assessment: Report given to RN and Post -op Vital signs reviewed and stable  Post vital signs: Reviewed and stable  Last Vitals:  Vitals:   11/19/17 1103  BP: 115/74  Pulse: 81  Resp: 16  Temp: 36.9 C  SpO2: 100%    Last Pain:  Vitals:   11/19/17 1103  TempSrc: Temporal         Complications: No apparent anesthesia complications

## 2017-11-19 NOTE — Anesthesia Preprocedure Evaluation (Addendum)
Anesthesia Evaluation  Patient identified by MRN, date of birth, ID band Patient awake    Reviewed: Allergy & Precautions, NPO status , Patient's Chart, lab work & pertinent test results  Airway Mallampati: II  TM Distance: >3 FB     Dental  (+) Teeth Intact   Pulmonary Current Smoker,    Pulmonary exam normal        Cardiovascular negative cardio ROS Normal cardiovascular exam+ Valvular Problems/Murmurs      Neuro/Psych negative neurological ROS  negative psych ROS   GI/Hepatic negative GI ROS, Neg liver ROS,   Endo/Other  negative endocrine ROS  Renal/GU negative Renal ROS     Musculoskeletal negative musculoskeletal ROS (+)   Abdominal Normal abdominal exam  (+)   Peds  Hematology negative hematology ROS (+)   Anesthesia Other Findings   Reproductive/Obstetrics                            Anesthesia Physical Anesthesia Plan  ASA: II  Anesthesia Plan: General   Post-op Pain Management:    Induction:   PONV Risk Score and Plan:   Airway Management Planned: Oral ETT  Additional Equipment:   Intra-op Plan:   Post-operative Plan: Extubation in OR  Informed Consent: I have reviewed the patients History and Physical, chart, labs and discussed the procedure including the risks, benefits and alternatives for the proposed anesthesia with the patient or authorized representative who has indicated his/her understanding and acceptance.     Plan Discussed with: CRNA and Surgeon  Anesthesia Plan Comments:         Anesthesia Quick Evaluation

## 2017-11-20 ENCOUNTER — Encounter: Payer: Self-pay | Admitting: General Surgery

## 2017-11-20 LAB — SURGICAL PATHOLOGY

## 2017-11-21 ENCOUNTER — Telehealth: Payer: Self-pay

## 2017-11-21 NOTE — Telephone Encounter (Signed)
Notified patient as instructed, patient pleased. Discussed follow-up appointments, patient agrees  

## 2017-11-21 NOTE — Telephone Encounter (Signed)
-----   Message from Kieth BrightlySeeplaputhur G Sankar, MD sent at 11/21/2017  9:36 AM EST ----- Please inform pt- path showed endometrioma as suspected

## 2017-11-26 ENCOUNTER — Telehealth: Payer: Self-pay | Admitting: General Surgery

## 2017-11-26 ENCOUNTER — Ambulatory Visit: Payer: Medicaid Other | Admitting: General Surgery

## 2017-11-26 NOTE — Telephone Encounter (Signed)
I have called & left a message asking patient to call to reschedule her p/o appointment with Dr Evette CristalSankar.

## 2017-12-04 ENCOUNTER — Ambulatory Visit (INDEPENDENT_AMBULATORY_CARE_PROVIDER_SITE_OTHER): Payer: Medicaid Other | Admitting: General Surgery

## 2017-12-04 ENCOUNTER — Encounter: Payer: Self-pay | Admitting: General Surgery

## 2017-12-04 VITALS — BP 124/74 | HR 80 | Resp 12 | Ht 66.0 in | Wt 161.0 lb

## 2017-12-04 DIAGNOSIS — R1904 Left lower quadrant abdominal swelling, mass and lump: Secondary | ICD-10-CM

## 2017-12-04 NOTE — Patient Instructions (Signed)
Patient to return as needed. The patient is aware to call back for any questions or concerns. 

## 2017-12-04 NOTE — Progress Notes (Signed)
Patient ID: Katelyn Jefferson, female   DOB: 1986-10-31, 31 y.o.   MRN: [id number]  Chief Complaint  Patient presents with  . Routine Post Op    HPI Katelyn Jefferson is a 31 y.o. female here today for her post op excision abdominal wall mass done on 11/19/2017. Patient states she is doing well.  HPI  Past Medical History:  Diagnosis Date  . Abdominal mass   . Dysrhythmia   . Heart murmur    Congential heart murmur  . Miscarriage     Past Surgical History:  Procedure Laterality Date  . CESAREAN SECTION N/A 02/28/2016   Procedure: CESAREAN SECTION;  Surgeon: Suzy Bouchardhomas J Schermerhorn, MD;  Location: [facility];  Service: Obstetrics;  Laterality: N/A;  . CESAREAN SECTION  2010  . COLONOSCOPY  31 years old  . 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   Procedure: EXCISION OF ABDOMINAL WALL MASS;  Surgeon: Kieth BrightlySankar, Cayleigh Paull G, MD;  Location: [facility];  Service: General;  Laterality: N/A;  . TUBAL LIGATION Bilateral 02/28/2016   Procedure: BILATERAL TUBAL LIGATION;  Surgeon: Suzy Bouchardhomas J Schermerhorn, MD;  Location: [facility];  Service: Obstetrics;  Laterality: Bilateral;  . WISDOM TOOTH EXTRACTION      Family History  Problem Relation Age of Onset  . Stroke Mother   . Cancer Paternal Grandfather     Social History Social History   Tobacco Use  . Smoking status: Current Every Day Smoker    Packs/day: 0.50    Years: 10.00    Pack years: 5.00    Types: Cigarettes  . Smokeless tobacco: Never Used  Substance Use Topics  . Alcohol use: No  . Drug use: No    Allergies  Allergen Reactions  . Cephalexin Other (See Comments)    Unknown  . Sulfa Antibiotics Hives  . Sulfur Hives    Current Outpatient Medications  Medication Sig Dispense Refill  . HYDROcodone-acetaminophen (NORCO/VICODIN) 5-325 MG tablet Take 1 tablet by mouth every 4 (four) hours as needed for moderate pain. 20 tablet 0  . ibuprofen (ADVIL,MOTRIN) 100 MG/5ML suspension Take 300 mg by mouth every 4 (four) hours  as needed for mild pain.     No current facility-administered medications for this visit.     Review of Systems Review of Systems  Constitutional: Negative.   Respiratory: Negative.   Cardiovascular: Negative.     Blood pressure 124/74, pulse 80, resp. rate 12, height 5\' 6"  (1.676 m), weight 161 lb (73 kg).  Physical Exam Physical Exam  Constitutional: She appears well-developed and well-nourished.  Abdominal: Soft. There is no tenderness.  Incision is clean and healing well Data Reviewed Op note reviewed  Path consistent with an endometrioma Assessment    Stable exam, incision is clean and well healed.     Plan    Patient to return as needed. The patient is aware to call back for any questions or concerns.   HPI, Physical Exam, Assessment and Plan have been scribed under the direction and in the presence of Kathreen CosierS. G. Jenisse Vullo, MD  Ples SpecterJessica Qualls, CMA I have completed the exam and reviewed the above documentation for accuracy and completeness.  I agree with the above.  [employer] has been used and any errors in dictation or transcription are unintentional.  Cintya Daughety G. Evette CristalSankar, M.D., F.A.C.S.        Gerlene BurdockSANKAR,Venessa Wickham G 12/05/2017, 9:14 AM

## 2018-10-27 ENCOUNTER — Emergency Department
Admission: EM | Admit: 2018-10-27 | Discharge: 2018-10-27 | Disposition: A | Discharge status: Home / Self Care | Payer: Medicaid Other | Attending: Emergency Medicine | Admitting: Emergency Medicine

## 2018-10-27 ENCOUNTER — Other Ambulatory Visit: Payer: Self-pay

## 2018-10-27 ENCOUNTER — Encounter: Payer: Self-pay | Admitting: Emergency Medicine

## 2018-10-27 DIAGNOSIS — R112 Nausea with vomiting, unspecified: Secondary | ICD-10-CM | POA: Diagnosis not present

## 2018-10-27 DIAGNOSIS — R197 Diarrhea, unspecified: Secondary | ICD-10-CM | POA: Diagnosis not present

## 2018-10-27 DIAGNOSIS — R82998 Other abnormal findings in urine: Secondary | ICD-10-CM | POA: Diagnosis not present

## 2018-10-27 DIAGNOSIS — Z79899 Other long term (current) drug therapy: Secondary | ICD-10-CM | POA: Insufficient documentation

## 2018-10-27 DIAGNOSIS — E860 Dehydration: Secondary | ICD-10-CM | POA: Diagnosis not present

## 2018-10-27 DIAGNOSIS — R1110 Vomiting, unspecified: Secondary | ICD-10-CM | POA: Diagnosis present

## 2018-10-27 DIAGNOSIS — F17210 Nicotine dependence, cigarettes, uncomplicated: Secondary | ICD-10-CM | POA: Diagnosis not present

## 2018-10-27 LAB — COMPREHENSIVE METABOLIC PANEL
ALBUMIN: 4.4 g/dL (ref 3.5–5.0)
ALT: 22 U/L (ref 0–44)
AST: 24 U/L (ref 15–41)
Alkaline Phosphatase: 70 U/L (ref 38–126)
Anion gap: 10 (ref 5–15)
BUN: 16 mg/dL (ref 6–20)
CHLORIDE: 106 mmol/L (ref 98–111)
CO2: 26 mmol/L (ref 22–32)
CREATININE: 0.79 mg/dL (ref 0.44–1.00)
Calcium: 9.6 mg/dL (ref 8.9–10.3)
GFR calc non Af Amer: >60 mL/min (ref >60)
Glucose, Bld: 107 mg/dL — ABNORMAL HIGH (ref 70–99)
Potassium: 3.9 mmol/L (ref 3.5–5.1)
SODIUM: 142 mmol/L (ref 135–145)
Total Bilirubin: 0.9 mg/dL (ref 0.3–1.2)
Total Protein: 8.2 g/dL — ABNORMAL HIGH (ref 6.5–8.1)

## 2018-10-27 LAB — URINALYSIS, COMPLETE (UACMP) WITH MICROSCOPIC
BILIRUBIN URINE: NEGATIVE
Glucose, UA: NEGATIVE mg/dL
Ketones, ur: 20 mg/dL — AB
LEUKOCYTES UA: NEGATIVE
Nitrite: NEGATIVE
Protein, ur: NEGATIVE mg/dL
Specific Gravity, Urine: 1.021 (ref 1.005–1.030)
pH: 5 (ref 5.0–8.0)

## 2018-10-27 LAB — CBC
HEMATOCRIT: 44.9 % (ref 36.0–46.0)
Hemoglobin: 15.6 g/dL — ABNORMAL HIGH (ref 12.0–15.0)
MCH: 29.8 pg (ref 26.0–34.0)
MCHC: 34.7 g/dL (ref 30.0–36.0)
MCV: 85.7 fL (ref 80.0–100.0)
NRBC: 0 % (ref 0.0–0.2)
Platelets: 345 10*3/uL (ref 150–400)
RBC: 5.24 MIL/uL — ABNORMAL HIGH (ref 3.87–5.11)
RDW: 11.9 % (ref 11.5–15.5)
WBC: 8 10*3/uL (ref 4.0–10.5)

## 2018-10-27 LAB — LIPASE, BLOOD: LIPASE: 37 U/L (ref 11–51)

## 2018-10-27 LAB — TROPONIN I

## 2018-10-27 LAB — POC URINE PREG, ED: Preg Test, Ur: NEGATIVE

## 2018-10-27 MED ORDER — SODIUM CHLORIDE 0.9 % IV BOLUS
1000.0000 mL | Freq: Once | INTRAVENOUS | Status: AC
  Administered 2018-10-27: 1000 mL via INTRAVENOUS

## 2018-10-27 MED ORDER — ACETAMINOPHEN 500 MG PO TABS
1000.0000 mg | ORAL_TABLET | Freq: Once | ORAL | Status: DC
  Filled 2018-10-27: qty 2

## 2018-10-27 MED ORDER — ONDANSETRON HCL 4 MG/2ML IJ SOLN
4.0000 mg | Freq: Once | INTRAMUSCULAR | Status: AC
  Administered 2018-10-27: 4 mg via INTRAVENOUS
  Filled 2018-10-27: qty 2

## 2018-10-27 NOTE — ED Provider Notes (Signed)
[facility] Emergency Department Provider Note  ____________________________________________  Time seen: Approximately 8:53 PM  I have reviewed the triage vital signs and the nursing notes.   HISTORY  Chief Complaint Emesis and Diarrhea   HPI Katelyn Jefferson is a 32 y.o. female no significant past medical history who presents for evaluation of nausea.  Patient reports a 4 days ago she had vomiting and diarrhea, several episodes over 24-hour period.  The vomiting and diarrhea have resolved however the nausea has persisted.  Patient reports that she has not had anything to eat or drink because the nausea returns.  She endorses a 10 pound weight loss over the last 4 days.  She reports feeling weak and generalized body aches.  No fever chills, no cough or congestion, no chest pain or shortness of breath, no abdominal pain, no dysuria, no vaginal discharge or vaginal bleeding.  No melena, hematemesis, hematochezia, coffee-ground emesis.  She has been taking Zofran at home with no relief.   Past Medical History:  Diagnosis Date  . Abdominal mass   . Dysrhythmia   . Heart murmur    Congential heart murmur  . Miscarriage     Patient Active Problem List   Diagnosis Date Noted  . Postoperative state 02/28/2016  . Labor and delivery indication for care or intervention 01/25/2016  . Decreased fetal movement 10/27/2015  . 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 History of poor fetal growth 08/31/2015    Past Surgical History:  Procedure Laterality Date  . CESAREAN SECTION N/A 02/28/2016   Procedure: CESAREAN SECTION;  Surgeon: Suzy Bouchardhomas J Schermerhorn, MD;  Location: [facility];  Service: Obstetrics;  Laterality: N/A;  . CESAREAN SECTION  2010  . COLONOSCOPY  32 years old  . 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  TUBAL LIGATION Bilateral 02/28/2016   Procedure: BILATERAL TUBAL LIGATION;  Surgeon: Suzy Bouchardhomas J Schermerhorn, MD;  Location: [facility] OR[facility];  Service: Obstetrics;  Laterality: Bilateral;  . WISDOM TOOTH EXTRACTION      Prior to Admission medications   Medication Sig Start Date End Date Taking? Authorizing Provider  HYDROcodone-acetaminophen (NORCO/VICODIN) 5-325 MG tablet Take 1 tablet by mouth every 4 (four) hours as needed for moderate pain. 11/19/17 11/19/18  Kieth BrightlySankar, Seeplaputhur G, MD  ibuprofen (ADVIL,MOTRIN) 100 MG/5ML suspension Take 300 mg by mouth every 4 (four) hours as needed for mild pain.    [provider]    Allergies Cephalexin; Sulfa antibiotics; and Sulfur  Family History  Problem Relation Age of Onset  . Stroke Mother   . Cancer Paternal Grandfather     Social History Social History   Tobacco Use  . Smoking status: Current Every Day Smoker    Packs/day: 0.50    Years: 10.00    Pack years: 5.00    Types: Cigarettes  . Smokeless tobacco: Never Used  Substance Use Topics  . Alcohol use: No  . Drug use: No    Review of Systems  Constitutional: Negative for fever. Eyes: Negative for visual changes. ENT: Negative for sore throat. Neck: No neck pain  Cardiovascular: Negative for chest pain. Respiratory: Negative for shortness of breath. Gastrointestinal: Negative for abdominal pain. + vomiting and diarrhea. Genitourinary: Negative for dysuria. Musculoskeletal: Negative for back pain. Skin: Negative for rash. Neurological: Negative for headaches, weakness or numbness. Psych: No SI or  HI  ____________________________________________   PHYSICAL EXAM:  VITAL SIGNS: ED Triage Vitals [10/27/18 1959]  Enc Vitals Group     BP (!) 129/91     Pulse Rate 84     Resp 20     Temp 98 F (36.7 C)     Temp Source Oral     SpO2 100 %     Weight 150 lb (68 kg)     Height 5\' 5"  (1.651 m)     Head Circumference      Peak Flow      Pain Score 0     Pain  Loc      Pain Edu?      Excl. in GC?     Constitutional: Alert and oriented. Well appearing and in no apparent distress. HEENT:      Head: Normocephalic and atraumatic.         Eyes: Conjunctivae are normal. Sclera is non-icteric.       Mouth/Throat: Mucous membranes are moist.       Neck: Supple with no signs of meningismus. Cardiovascular: Regular rate and rhythm. No murmurs, gallops, or rubs. 2+ symmetrical distal pulses are present in all extremities. No JVD. Respiratory: Normal respiratory effort. Lungs are clear to auscultation bilaterally. No wheezes, crackles, or rhonchi.  Gastrointestinal: Soft, non tender, and non distended with positive bowel sounds. No rebound or guarding. Musculoskeletal: Nontender with normal range of motion in all extremities. No edema, cyanosis, or erythema of extremities. Neurologic: Normal speech and language. Face is symmetric. Moving all extremities. No gross focal neurologic deficits are appreciated. Skin: Skin is warm, dry and intact. No rash noted. Psychiatric: Mood and affect are normal. Speech and behavior are normal.  ____________________________________________   LABS (all labs ordered are listed, but only abnormal results are displayed)  Labs Reviewed  COMPREHENSIVE METABOLIC PANEL - Abnormal; Notable for the following components:      Result Value   Glucose, Bld 107 (*)    Total Protein 8.2 (*)    All other components within normal limits  CBC - Abnormal; Notable for the following components:   RBC 5.24 (*)    Hemoglobin 15.6 (*)    All other components within normal limits  URINALYSIS, COMPLETE (UACMP) WITH MICROSCOPIC - Abnormal; Notable for the following components:   Color, Urine AMBER (*)    APPearance CLEAR (*)    Hgb urine dipstick LARGE (*)    Ketones, ur 20 (*)    Bacteria, UA RARE (*)    All other components within normal limits  URINE CULTURE  LIPASE, BLOOD  TROPONIN I  POC URINE PREG, ED    ____________________________________________  EKG  ED ECG REPORT I, Nita Sickle, the attending physician, personally viewed and interpreted this ECG.  Normal sinus rhythm, rate of 74, normal intervals, normal axis, no ST elevations or depressions, T wave inversions in inferior leads.  No prior for comparison. ____________________________________________  RADIOLOGY  none  ____________________________________________   PROCEDURES  Procedure(s) performed: None Procedures Critical Care performed:  None ____________________________________________   INITIAL IMPRESSION / ASSESSMENT AND PLAN / ED COURSE   32 y.o. female no significant past medical history who presents for evaluation of persistent nausea after 24 period of vomiting and diarrhea 4 days ago.  Patient is extremely well-appearing, no distress, abdomen is soft, lungs are clear to auscultation, no meningeal signs, no flulike symptoms, vitals are within normal limits with no fever.  Labs show normal white count, no evidence of anemia,  normal electrolytes, normal lipase.  UA and pregnancy tests are pending to rule out ketonuria, UTI, pregnancy.  EKG was done in triage shows T wave inversions in inferior leads with no prior for comparison therefore troponin was added on.  Patient has no chest pain or shortness of breath.    _________________________ 10:41 PM on 10/27/2018 ----------------------------------------- Urine showing mild ketones, some bacteria but patient has no symptoms of a UTI.  Urine culture has been sent.  Pregnancy test is negative.  CBC, CMP and lipase with no acute findings.  Patient feels improved.  Nausea has resolved after liter of IV fluids and IV Zofran.  She is tolerating p.o.  Recommended increase oral hydration and bland diet for the next few days and close follow-up with primary care doctor.  Discussed standard return precautions.    As part of my medical decision making, I reviewed the  following data within the electronic MEDICAL RECORD NUMBER Nursing notes reviewed and incorporated, Labs reviewed , Old chart reviewed, Notes from prior ED visits and [place] Controlled Substance Database    Pertinent labs & imaging results that were available during my care of the patient were reviewed by me and considered in my medical decision making (see chart for details).    ____________________________________________   FINAL CLINICAL IMPRESSION(S) / ED DIAGNOSES  Final diagnoses:  Dehydration  Nausea vomiting and diarrhea      NEW MEDICATIONS STARTED DURING THIS VISIT:  ED Discharge Orders    None       Note:  This document was prepared using Dragon voice recognition software and may include unintentional dictation errors.    Nita Sickle, MD 10/27/18 [id number]

## 2018-10-27 NOTE — ED Triage Notes (Signed)
Pt presents to ED with vomiting Friday X2 and diarrhea Friday X2. Symptoms started just after receiving "flu and pneumonia shots". zofran taken at home with little relief. States she has been unable to eat or drink since onset of her symptoms an report she is feeling dehydrated and has only voided 2 times today.

## 2018-10-29 LAB — URINE CULTURE

## 2018-11-08 ENCOUNTER — Emergency Department: Payer: Medicaid Other

## 2018-11-08 ENCOUNTER — Encounter: Payer: Self-pay | Admitting: Emergency Medicine

## 2018-11-08 ENCOUNTER — Emergency Department
Admission: EM | Admit: 2018-11-08 | Discharge: 2018-11-08 | Disposition: A | Discharge status: Home / Self Care | Payer: Medicaid Other | Attending: Emergency Medicine | Admitting: Emergency Medicine

## 2018-11-08 DIAGNOSIS — R0602 Shortness of breath: Secondary | ICD-10-CM | POA: Insufficient documentation

## 2018-11-08 DIAGNOSIS — F17210 Nicotine dependence, cigarettes, uncomplicated: Secondary | ICD-10-CM | POA: Diagnosis not present

## 2018-11-08 DIAGNOSIS — R0789 Other chest pain: Secondary | ICD-10-CM | POA: Diagnosis present

## 2018-11-08 DIAGNOSIS — R079 Chest pain, unspecified: Secondary | ICD-10-CM

## 2018-11-08 LAB — CBC
HCT: 42.2 % (ref 36.0–46.0)
Hemoglobin: 14.5 g/dL (ref 12.0–15.0)
MCH: 29.9 pg (ref 26.0–34.0)
MCHC: 34.4 g/dL (ref 30.0–36.0)
MCV: 87 fL (ref 80.0–100.0)
Platelets: 284 10*3/uL (ref 150–400)
RBC: 4.85 MIL/uL (ref 3.87–5.11)
RDW: 12.1 % (ref 11.5–15.5)
WBC: 8.6 10*3/uL (ref 4.0–10.5)
nRBC: 0 % (ref 0.0–0.2)

## 2018-11-08 LAB — URINALYSIS, COMPLETE (UACMP) WITH MICROSCOPIC
Bacteria, UA: NONE SEEN
Bilirubin Urine: NEGATIVE
Glucose, UA: NEGATIVE mg/dL
Hgb urine dipstick: NEGATIVE
Ketones, ur: 5 mg/dL — AB
LEUKOCYTES UA: NEGATIVE
Nitrite: NEGATIVE
Protein, ur: NEGATIVE mg/dL
Specific Gravity, Urine: 1.014 (ref 1.005–1.030)
pH: 8 (ref 5.0–8.0)

## 2018-11-08 LAB — URINE DRUG SCREEN, QUALITATIVE (ARMC ONLY)
AMPHETAMINES, UR SCREEN: NOT DETECTED
Barbiturates, Ur Screen: NOT DETECTED
Benzodiazepine, Ur Scrn: NOT DETECTED
Cannabinoid 50 Ng, Ur ~~LOC~~: NOT DETECTED
Cocaine Metabolite,Ur ~~LOC~~: NOT DETECTED
MDMA (ECSTASY) UR SCREEN: NOT DETECTED
Methadone Scn, Ur: NOT DETECTED
Opiate, Ur Screen: NOT DETECTED
Phencyclidine (PCP) Ur S: NOT DETECTED
Tricyclic, Ur Screen: NOT DETECTED

## 2018-11-08 LAB — TROPONIN I
Troponin I: <0.03 ng/mL (ref <0.03)
Troponin I: <0.03 ng/mL (ref <0.03)

## 2018-11-08 LAB — COMPREHENSIVE METABOLIC PANEL
ALT: 16 U/L (ref 0–44)
AST: 22 U/L (ref 15–41)
Albumin: 4.3 g/dL (ref 3.5–5.0)
Alkaline Phosphatase: 55 U/L (ref 38–126)
Anion gap: 8 (ref 5–15)
BUN: 16 mg/dL (ref 6–20)
CO2: 24 mmol/L (ref 22–32)
Calcium: 8.8 mg/dL — ABNORMAL LOW (ref 8.9–10.3)
Chloride: 109 mmol/L (ref 98–111)
Creatinine, Ser: 0.71 mg/dL (ref 0.44–1.00)
GFR calc Af Amer: >60 mL/min (ref >60)
GFR calc non Af Amer: >60 mL/min (ref >60)
Glucose, Bld: 117 mg/dL — ABNORMAL HIGH (ref 70–99)
POTASSIUM: 3.4 mmol/L — AB (ref 3.5–5.1)
Sodium: 141 mmol/L (ref 135–145)
Total Bilirubin: 0.4 mg/dL (ref 0.3–1.2)
Total Protein: 7.3 g/dL (ref 6.5–8.1)

## 2018-11-08 LAB — LIPASE, BLOOD: Lipase: 47 U/L (ref 11–51)

## 2018-11-08 LAB — FIBRIN DERIVATIVES D-DIMER (ARMC ONLY): Fibrin derivatives D-dimer (ARMC): 549.37 ng/mL (FEU) — ABNORMAL HIGH (ref 0.00–499.00)

## 2018-11-08 LAB — POCT PREGNANCY, URINE: Preg Test, Ur: NEGATIVE

## 2018-11-08 MED ORDER — FAMOTIDINE IN NACL 20-0.9 MG/50ML-% IV SOLN
20.0000 mg | Freq: Once | INTRAVENOUS | Status: AC
  Administered 2018-11-08: 20 mg via INTRAVENOUS
  Filled 2018-11-08: qty 50

## 2018-11-08 MED ORDER — SODIUM CHLORIDE 0.9 % IV BOLUS
1000.0000 mL | Freq: Once | INTRAVENOUS | Status: AC
  Administered 2018-11-08: 1000 mL via INTRAVENOUS

## 2018-11-08 MED ORDER — IOHEXOL 350 MG/ML SOLN
75.0000 mL | Freq: Once | INTRAVENOUS | Status: AC | PRN
  Administered 2018-11-08: 75 mL via INTRAVENOUS

## 2018-11-08 NOTE — ED Notes (Signed)
Ct notified at this time  Of IV placement.

## 2018-11-08 NOTE — Discharge Instructions (Addendum)
Return to the ER for worsening symptoms, persistent vomiting, difficulty breathing or other concerns.  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

## 2018-11-08 NOTE — ED Provider Notes (Addendum)
[facility] Emergency Department Provider Note   ____________________________________________   First MD Initiated Contact with Patient 11/08/18 [phone]     (approximate)  I have reviewed the triage vital signs and the nursing notes.   HISTORY  Chief Complaint Chest Pain    HPI Katelyn Jefferson is a 32 y.o. female who presents to the ED from home with a chief complaint of chest pain.  Patient was seen in the ED 2 weeks ago for GI distress.  States she awoke with nausea around 1 AM and took a Zofran.  Shortly thereafter she developed central chest burning sensation and associated shortness of breath.  None currently.  Denies recent fever, chills, cough, congestion, abdominal pain, vomiting, dysuria, diarrhea.  Denies recent travel, trauma or hormone use.  States she recently had Lexapro changed to Zoloft for which she has been on for the past week.   Past Medical History:  Diagnosis Date  . Abdominal mass   . Dysrhythmia   . Heart murmur    Congential heart murmur  . Miscarriage     Patient Active Problem List   Diagnosis Date Noted  . Postoperative state 02/28/2016  . Labor and delivery indication for care or intervention 01/25/2016  . Decreased fetal movement 10/27/2015  . 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 History of poor fetal growth 08/31/2015    Past Surgical History:  Procedure Laterality Date  . CESAREAN SECTION N/A 02/28/2016   Procedure: CESAREAN SECTION;  Surgeon: Suzy Bouchardhomas J Schermerhorn, MD;  Location: [facility] ORS;  Service: Obstetrics;  Laterality: N/A;  . CESAREAN SECTION  2010  . COLONOSCOPY  32 years old  . 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 TUBAL LIGATION Bilateral 02/28/2016   Procedure: BILATERAL TUBAL LIGATION;  Surgeon: Suzy Bouchardhomas J Schermerhorn, MD;  Location: [facility] ORS;  Service:  Obstetrics;  Laterality: Bilateral;  . WISDOM TOOTH EXTRACTION      Prior to Admission medications   Medication Sig Start Date End Date Taking? Authorizing Provider  HYDROcodone-acetaminophen (NORCO/VICODIN) 5-325 MG tablet Take 1 tablet by mouth every 4 (four) hours as needed for moderate pain. 11/19/17 11/19/18  Kieth BrightlySankar, Seeplaputhur G, MD  ibuprofen (ADVIL,MOTRIN) 100 MG/5ML suspension Take 300 mg by mouth every 4 (four) hours as needed for mild pain.    [provider]    Allergies Cephalexin; Sulfa antibiotics; and Sulfur  Family History  Problem Relation Age of Onset  . Stroke Mother   . Cancer Paternal Grandfather     Social History Social History   Tobacco Use  . Smoking status: Current Every Day Smoker    Packs/day: 0.50    Years: 10.00    Pack years: 5.00    Types: Cigarettes  . Smokeless tobacco: Never Used  Substance Use Topics  . Alcohol use: No  . Drug use: No    Review of Systems  Constitutional: No fever/chills Eyes: No visual changes. ENT: No sore throat. Cardiovascular: Positive for chest pain. Respiratory: Denies shortness of breath. Gastrointestinal: No abdominal pain.  Positive for nausea, no vomiting.  No diarrhea.  No constipation. Genitourinary: Negative for dysuria. Musculoskeletal: Negative for back pain. Skin: Negative for rash. Neurological: Negative for headaches, focal weakness or numbness. Psychiatric: Positive for anxiety.  ____________________________________________   PHYSICAL EXAM:  VITAL SIGNS: ED Triage Vitals  Enc Vitals Group  BP 11/08/18 0226 126/83     Pulse Rate 11/08/18 0226 (!) 102     Resp 11/08/18 0226 16     Temp 11/08/18 0226 98.2 F (36.8 C)     Temp Source 11/08/18 0226 Oral     SpO2 11/08/18 0226 98 %     Weight 11/08/18 0224 155 lb (70.3 kg)     Height 11/08/18 0224 5\' 5"  (1.651 m)     Head Circumference --      Peak Flow --      Pain Score 11/08/18 0223 2     Pain Loc --      Pain Edu?  --      Excl. in GC? --     Constitutional: Alert and oriented. Well appearing and in no acute distress.  Mildly anxious. Eyes: Conjunctivae are normal. PERRL. EOMI. Head: Atraumatic. Nose: No congestion/rhinnorhea. Mouth/Throat: Mucous membranes are moist.  Oropharynx non-erythematous. Neck: No stridor.   Cardiovascular: Normal rate, regular rhythm. Grossly normal heart sounds.  Good peripheral circulation. Respiratory: Normal respiratory effort.  No retractions. Lungs CTAB. Gastrointestinal: Soft and nontender to light or deep palpation. No distention. No abdominal bruits. No CVA tenderness. Musculoskeletal: No lower extremity tenderness nor edema.  No joint effusions. Neurologic:  Normal speech and language. No gross focal neurologic deficits are appreciated. No gait instability. Skin:  Skin is warm, dry and intact. No rash noted. Psychiatric: Mood and affect are normal. Speech and behavior are normal.  ____________________________________________   LABS (all labs ordered are listed, but only abnormal results are displayed)  Labs Reviewed  COMPREHENSIVE METABOLIC PANEL - Abnormal; Notable for the following components:      Result Value   Potassium 3.4 (*)    Glucose, Bld 117 (*)    Calcium 8.8 (*)    All other components within normal limits  URINALYSIS, COMPLETE (UACMP) WITH MICROSCOPIC - Abnormal; Notable for the following components:   Color, Urine YELLOW (*)    APPearance CLEAR (*)    Ketones, ur 5 (*)    All other components within normal limits  FIBRIN DERIVATIVES D-DIMER ([facility] ONLY) - Abnormal; Notable for the following components:   Fibrin derivatives D-dimer ([facility]) 549.37 (*)    All other components within normal limits  CBC  TROPONIN I  LIPASE, BLOOD  URINE DRUG SCREEN, QUALITATIVE ([facility] ONLY)  TROPONIN I  POC URINE PREG, ED  POCT PREGNANCY, URINE   ____________________________________________  EKG  ED ECG REPORT I, Zohaib Heeney J, the attending physician,  personally viewed and interpreted this ECG.   Date: 11/08/2018  EKG Time: 0224  Rate: 95  Rhythm: normal EKG, normal sinus rhythm  Axis: Normal  Intervals:none  ST&T Change: Inverted T waves in inferior lateral leads New T wave inversion in lead V3 since 10/28/2018  ____________________________________________  RADIOLOGY  ED MD interpretation: No acute cardiopulmonary process  Official radiology report(s): Ct Angio Chest Pe W/cm &/or Wo Cm  Result Date: 11/08/2018 CLINICAL DATA:  Central chest pain and shortness of breath. EXAM: CT ANGIOGRAPHY CHEST WITH CONTRAST TECHNIQUE: Multidetector CT imaging of the chest was performed using the standard protocol during bolus administration of intravenous contrast. Multiplanar CT image reconstructions and MIPs were obtained to evaluate the vascular anatomy. CONTRAST:  75mL OMNIPAQUE IOHEXOL 350 MG/ML SOLN COMPARISON:  Chest radiograph 11/08/2018 FINDINGS: Cardiovascular: Satisfactory opacification of the pulmonary arteries to the segmental level. No evidence of pulmonary embolism. Normal heart size. No pericardial effusion. Mediastinum/Nodes: No enlarged mediastinal, hilar, or axillary lymph nodes.  Thyroid gland, trachea, and esophagus demonstrate no significant findings. Soft tissue thickening in the anterior mediastinum, at the anatomic location of the thymus measures 2.4 cm. Lungs/Pleura: Lungs are clear. No pleural effusion or pneumothorax. Upper Abdomen: No acute abnormality. Musculoskeletal: No chest wall abnormality. No acute or significant osseous findings. Review of the MIP images confirms the above findings. IMPRESSION: No evidence of pulmonary embolus or other acute vascular abnormality. 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 Other solid tumors such as neurogenic tumors, or teratoma are considered less likely. Electronically Signed   By: Ted Mcalpine  M.D.   On: 11/08/2018 09:41    ____________________________________________   PROCEDURES  Procedure(s) performed: None  Procedures  Critical Care performed: No  ____________________________________________   INITIAL IMPRESSION / ASSESSMENT AND PLAN / ED COURSE  As part of my medical decision making, I reviewed the following data within the electronic MEDICAL RECORD NUMBER History obtained from family, Nursing notes reviewed and incorporated, Labs reviewed, EKG interpreted, Old chart reviewed, Radiograph reviewed and Notes from prior ED visits   32 year old female who presents with nausea and chest pain. Differential diagnosis includes, but is not limited to, ACS, aortic dissection, pulmonary embolism, cardiac tamponade, pneumothorax, pneumonia, pericarditis, myocarditis, GI-related causes including esophagitis/gastritis, and musculoskeletal chest wall pain.    Initial troponin is unremarkable.  LFTs and lipase unremarkable.  Will check timed troponin, d-dimer and urinalysis.  Initiate IV fluid resuscitation and 20 mg IV Pepcid for burning sensation.  Clinical Course as of Nov 09 708  Wynelle Link Nov 08, 2018  1610 Patient sleeping in no acute distress.  Updated her significant other on negative repeat troponin.  D-dimer is pending.  Anticipate discharge home if that is unremarkable.   [JS]  [id number] Updated patient and spouse on elevated d-dimer.  Will obtain CTA chest to evaluate for PE.  At this time care is transferred to Dr. Lenard Lance.  Anticipate discharge home if CT is negative for PE.   [JS]  0939 Opiate, Ur Screen: NONE DETECTED [KP]    Clinical Course User Index [JS] Irean Hong, MD [KP] Minna Antis, MD     ____________________________________________   FINAL CLINICAL IMPRESSION(S) / ED DIAGNOSES  Final diagnoses:  Nonspecific chest pain     ED Discharge Orders    None       Note:  This document was prepared using Dragon voice recognition software and may  include unintentional dictation errors.    Irean Hong, MD 11/08/18 [id number]    Irean Hong, MD 11/09/18 [phone]

## 2018-11-08 NOTE — ED Triage Notes (Addendum)
Patient states that she woke up with nausea about 01:00 with nausea and took some Zofran. Patient states that she then developed central chest pain and shortness of breath. Patient states that she was not sure if she was having a panic attack but that this felt different.

## 2018-11-08 NOTE — ED Provider Notes (Signed)
-----------------------------------------   9:45 AM on 11/08/2018 -----------------------------------------  Patient care assumed from Dr. Dolores FrameSung.  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  Patient will follow-up with her primary care regarding this for continued monitoring.  Otherwise the patient is safe for discharge from the emergency department otherwise reassuring labs with normal vitals.   Minna AntisPaduchowski, Tong Pieczynski, MD 11/08/18 [phone]

## 2018-11-17 ENCOUNTER — Other Ambulatory Visit: Payer: Self-pay

## 2018-11-17 DIAGNOSIS — F17210 Nicotine dependence, cigarettes, uncomplicated: Secondary | ICD-10-CM | POA: Diagnosis not present

## 2018-11-17 DIAGNOSIS — R110 Nausea: Secondary | ICD-10-CM | POA: Insufficient documentation

## 2018-11-17 LAB — COMPREHENSIVE METABOLIC PANEL
ALT: 20 U/L (ref 0–44)
AST: 24 U/L (ref 15–41)
Albumin: 4.3 g/dL (ref 3.5–5.0)
Alkaline Phosphatase: 59 U/L (ref 38–126)
Anion gap: 8 (ref 5–15)
BUN: 13 mg/dL (ref 6–20)
CALCIUM: 9.6 mg/dL (ref 8.9–10.3)
CO2: 23 mmol/L (ref 22–32)
Chloride: 106 mmol/L (ref 98–111)
Creatinine, Ser: 0.76 mg/dL (ref 0.44–1.00)
GFR calc non Af Amer: >60 mL/min (ref >60)
Glucose, Bld: 98 mg/dL (ref 70–99)
Potassium: 3.2 mmol/L — ABNORMAL LOW (ref 3.5–5.1)
Sodium: 137 mmol/L (ref 135–145)
TOTAL PROTEIN: 7.6 g/dL (ref 6.5–8.1)
Total Bilirubin: 0.4 mg/dL (ref 0.3–1.2)

## 2018-11-17 LAB — CBC
HCT: 43.5 % (ref 36.0–46.0)
Hemoglobin: 14.6 g/dL (ref 12.0–15.0)
MCH: 29.3 pg (ref 26.0–34.0)
MCHC: 33.6 g/dL (ref 30.0–36.0)
MCV: 87.3 fL (ref 80.0–100.0)
Platelets: 268 10*3/uL (ref 150–400)
RBC: 4.98 MIL/uL (ref 3.87–5.11)
RDW: 12.1 % (ref 11.5–15.5)
WBC: 10.2 10*3/uL (ref 4.0–10.5)
nRBC: 0 % (ref 0.0–0.2)

## 2018-11-17 LAB — URINALYSIS, COMPLETE (UACMP) WITH MICROSCOPIC
Bilirubin Urine: NEGATIVE
GLUCOSE, UA: NEGATIVE mg/dL
Hgb urine dipstick: NEGATIVE
Ketones, ur: NEGATIVE mg/dL
Leukocytes, UA: NEGATIVE
Nitrite: NEGATIVE
Protein, ur: NEGATIVE mg/dL
Specific Gravity, Urine: 1.001 — ABNORMAL LOW (ref 1.005–1.030)
pH: 7 (ref 5.0–8.0)

## 2018-11-17 LAB — TROPONIN I: Troponin I: <0.03 ng/mL (ref <0.03)

## 2018-11-17 LAB — POCT PREGNANCY, URINE: Preg Test, Ur: NEGATIVE

## 2018-11-17 NOTE — ED Triage Notes (Addendum)
Pt in with co nausea tonight no vomiting, and states "feels hot". PT also co urinary frequency no co pain at this time. PT also co dizziness states was referred to cardiologist recently for abnormal EKG.

## 2018-11-18 ENCOUNTER — Emergency Department
Admission: EM | Admit: 2018-11-18 | Discharge: 2018-11-18 | Disposition: A | Discharge status: Home / Self Care | Payer: Medicaid Other | Attending: Emergency Medicine | Admitting: Emergency Medicine

## 2018-11-18 DIAGNOSIS — R110 Nausea: Secondary | ICD-10-CM

## 2018-11-18 NOTE — ED Provider Notes (Signed)
[facility] Emergency Department Provider Note  ____________________________________________   First MD Initiated Contact with Patient 11/18/18 0126     (approximate)  I have reviewed the triage vital signs and the nursing notes.   HISTORY  Chief Complaint Nausea    HPI Katelyn Jefferson is a 32 y.o. female with medical issues as listed below who presents for evaluation of nausea.  She reports that she has had nausea throughout the course the day today, waxing and waning, and severe.  She has also felt hot today but is unaware of whether or not she has a fever.  She has been urinating a lot today and reports that it is "like water, completely clear, not even yellow".  She has no dysuria or foul smell to the urine.  She denies chest pain, shortness of breath, vomiting, abdominal pain, and diarrhea.  She reports no pelvic pain or vaginal complaints or concerns.  She denies any recent food or dietary changes.  She reports starting Zoloft within the last couple of weeks.  Her only other complaint is that sometimes she feels a bit lightheaded or dizzy.  Past Medical History:  Diagnosis Date  . Abdominal mass   . Dysrhythmia   . Heart murmur    Congential heart murmur  . Miscarriage     Patient Active Problem List   Diagnosis Date Noted  . Postoperative state 02/28/2016  . Labor and delivery indication for care or intervention 01/25/2016  . Decreased fetal movement 10/27/2015  . 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 History of poor fetal growth 08/31/2015    Past Surgical History:  Procedure Laterality Date  . CESAREAN SECTION N/A 02/28/2016   Procedure: CESAREAN SECTION;  Surgeon: Suzy Bouchard, MD;  Location: [facility] ORS;  Service: Obstetrics;  Laterality: N/A;  . CESAREAN SECTION  2010  . COLONOSCOPY  32 years old  . 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 TUBAL LIGATION Bilateral 02/28/2016   Procedure: BILATERAL TUBAL LIGATION;  Surgeon: Suzy Bouchard, MD;  Location: [facility] ORS;  Service: Obstetrics;  Laterality: Bilateral;  . WISDOM TOOTH EXTRACTION      Prior to Admission medications   Medication Sig Start Date End Date Taking? Authorizing Provider  HYDROcodone-acetaminophen (NORCO/VICODIN) 5-325 MG tablet Take 1 tablet by mouth every 4 (four) hours as needed for moderate pain. 11/19/17 11/19/18  Kieth Brightly, MD  ibuprofen (ADVIL,MOTRIN) 100 MG/5ML suspension Take 300 mg by mouth every 4 (four) hours as needed for mild pain.    [provider]    Allergies Cephalexin; Sulfa antibiotics; and Sulfur  Family History  Problem Relation Age of Onset  . Stroke Mother   . Cancer Paternal Grandfather     Social History Social History   Tobacco Use  . Smoking status: Current Every Day Smoker    Packs/day: 0.50    Years: 10.00    Pack years: 5.00    Types: Cigarettes  . Smokeless tobacco: Never Used  Substance Use Topics  . Alcohol use: No  . Drug use: No    Review of Systems Constitutional: No fever/chills Eyes: No visual changes. ENT: No sore throat. Cardiovascular: Denies chest pain. Respiratory: Denies shortness of breath. Gastrointestinal: No abdominal pain.  Nausea, no vomiting.  No diarrhea.  No constipation. Genitourinary: Negative for dysuria.  Polyuria with "  clear" urine. Musculoskeletal: Negative for neck pain.  Negative for back pain. Integumentary: Negative for rash. Neurological: Occasional lightheadedness.  Negative for headaches, focal weakness or numbness.   ____________________________________________   PHYSICAL EXAM:  VITAL SIGNS: ED Triage Vitals  Enc Vitals Group     BP 11/17/18 2121 116/87     Pulse Rate 11/17/18 2121 92     Resp 11/17/18 2121 20     Temp 11/17/18 2121 (!) 97.5 F (36.4 C)     Temp Source  11/17/18 2121 Oral     SpO2 11/17/18 2121 96 %     Weight 11/17/18 2123 63.5 kg (140 lb)     Height 11/17/18 2123 1.651 m (5\' 5" )     Head Circumference --      Peak Flow --      Pain Score 11/17/18 2123 0     Pain Loc --      Pain Edu? --      Excl. in GC? --     Constitutional: Alert and oriented. Well appearing and in no acute distress. Eyes: Conjunctivae are normal.  Head: Atraumatic. Nose: No congestion/rhinnorhea. Mouth/Throat: Mucous membranes are moist. Neck: No stridor.  No meningeal signs.   Cardiovascular: Normal rate, regular rhythm. Good peripheral circulation. Grossly normal heart sounds. Respiratory: Normal respiratory effort.  No retractions. Lungs CTAB. Gastrointestinal: Soft and nontender. No distention.  Musculoskeletal: No lower extremity tenderness nor edema. No gross deformities of extremities. Neurologic:  Normal speech and language. No gross focal neurologic deficits are appreciated.  Skin:  Skin is warm, dry and intact. No rash noted. Psychiatric: Mood and affect are normal. Speech and behavior are normal.  ____________________________________________   LABS (all labs ordered are listed, but only abnormal results are displayed)  Labs Reviewed  COMPREHENSIVE METABOLIC PANEL - Abnormal; Notable for the following components:      Result Value   Potassium 3.2 (*)    All other components within normal limits  URINALYSIS, COMPLETE (UACMP) WITH MICROSCOPIC - Abnormal; Notable for the following components:   Color, Urine COLORLESS (*)    APPearance CLEAR (*)    Specific Gravity, Urine 1.001 (*)    Bacteria, UA RARE (*)    All other components within normal limits  URINE CULTURE  CBC  TROPONIN I  POCT PREGNANCY, URINE  POC URINE PREG, ED   ____________________________________________  EKG  ED ECG REPORT I, Loleta Roseory Takota Cahalan, the attending physician, personally viewed and interpreted this ECG.  Date: 11/17/2018 EKG Time: 21:30 Rate: 84 Rhythm:  normal sinus rhythm QRS Axis: normal Intervals: normal ST/T Wave abnormalities: normal Narrative Interpretation: no evidence of acute ischemia  ____________________________________________  RADIOLOGY   ED MD interpretation: No indication for imaging  Official radiology report(s): No results found.  ____________________________________________   PROCEDURES  Critical Care performed: No   Procedure(s) performed:   Procedures   ____________________________________________   INITIAL IMPRESSION / ASSESSMENT AND PLAN / ED COURSE  As part of my medical decision making, I reviewed the following data within the electronic MEDICAL RECORD NUMBER Nursing notes reviewed and incorporated, Labs reviewed , Old chart reviewed and Notes from prior ED visits    Differential diagnosis includes, but is not limited to, medication side effect, viral illness, SBO/ileus, acute infection.  The patient's lab work is all very reassuring.  There was only rare bacteria seen and no leukocytes.  I do not think that she has a urinary tract infection but I have sent a urine culture but I will  not treat empirically.  I think most likely she is having a medication side effect of the Zoloft.  We discussed it and I provided reassurance.  There is no indication for imaging.  I encouraged close outpatient follow-up with her primary care doctor and she understands and agrees with the plan.    I gave my usual and customary return precautions.    ____________________________________________  FINAL CLINICAL IMPRESSION(S) / ED DIAGNOSES  Final diagnoses:  Nausea     MEDICATIONS GIVEN DURING THIS VISIT:  Medications - No data to display   ED Discharge Orders    None       Note:  This document was prepared using Dragon voice recognition software and may include unintentional dictation errors.    Loleta Rose, MD 11/18/18 [phone]

## 2018-11-18 NOTE — Discharge Instructions (Signed)
Your workup in the Emergency Department today was reassuring.  We did not find any specific abnormalities.  Your symptoms MAY be the result of your Zoloft, but we do not recommend making any changes at this time.  We recommend you drink plenty of fluids, take your regular medications and/or any new ones prescribed today, and follow up with the doctor(s) listed in these documents as recommended.  Return to the Emergency Department if you develop new or worsening symptoms that concern you.

## 2018-11-18 NOTE — ED Notes (Signed)
Dr. Forbach at the bedside for pt evaluation  

## 2018-11-19 LAB — URINE CULTURE: Special Requests: NORMAL

## 2018-12-19 ENCOUNTER — Encounter: Payer: Self-pay | Admitting: Emergency Medicine

## 2018-12-19 ENCOUNTER — Emergency Department
Admission: EM | Admit: 2018-12-19 | Discharge: 2018-12-19 | Disposition: A | Discharge status: Home / Self Care | Payer: Medicaid Other | Attending: Emergency Medicine | Admitting: Emergency Medicine

## 2018-12-19 ENCOUNTER — Other Ambulatory Visit: Payer: Self-pay

## 2018-12-19 DIAGNOSIS — R109 Unspecified abdominal pain: Secondary | ICD-10-CM | POA: Insufficient documentation

## 2018-12-19 DIAGNOSIS — Z5321 Procedure and treatment not carried out due to patient leaving prior to being seen by health care provider: Secondary | ICD-10-CM | POA: Insufficient documentation

## 2018-12-19 DIAGNOSIS — R197 Diarrhea, unspecified: Secondary | ICD-10-CM | POA: Insufficient documentation

## 2018-12-19 NOTE — ED Triage Notes (Signed)
Per tech patient chose to leave without being seen at protocols.

## 2018-12-19 NOTE — ED Triage Notes (Signed)
Diarrhea x 4 days. Abdominal pain.

## 2018-12-29 ENCOUNTER — Emergency Department
Admission: EM | Admit: 2018-12-29 | Discharge: 2018-12-29 | Disposition: A | Discharge status: Home / Self Care | Payer: Medicaid Other | Attending: Emergency Medicine | Admitting: Emergency Medicine

## 2018-12-29 ENCOUNTER — Encounter: Payer: Self-pay | Admitting: Emergency Medicine

## 2018-12-29 ENCOUNTER — Other Ambulatory Visit: Payer: Self-pay

## 2018-12-29 ENCOUNTER — Emergency Department: Payer: Medicaid Other

## 2018-12-29 DIAGNOSIS — F17210 Nicotine dependence, cigarettes, uncomplicated: Secondary | ICD-10-CM | POA: Insufficient documentation

## 2018-12-29 DIAGNOSIS — R1031 Right lower quadrant pain: Secondary | ICD-10-CM | POA: Insufficient documentation

## 2018-12-29 DIAGNOSIS — K8020 Calculus of gallbladder without cholecystitis without obstruction: Secondary | ICD-10-CM | POA: Insufficient documentation

## 2018-12-29 DIAGNOSIS — R109 Unspecified abdominal pain: Secondary | ICD-10-CM

## 2018-12-29 LAB — URINALYSIS, COMPLETE (UACMP) WITH MICROSCOPIC
Bacteria, UA: NONE SEEN
Bilirubin Urine: NEGATIVE
Glucose, UA: NEGATIVE mg/dL
Ketones, ur: NEGATIVE mg/dL
Leukocytes, UA: NEGATIVE
Nitrite: NEGATIVE
Protein, ur: NEGATIVE mg/dL
Specific Gravity, Urine: 1.021 (ref 1.005–1.030)
pH: 6 (ref 5.0–8.0)

## 2018-12-29 LAB — COMPREHENSIVE METABOLIC PANEL
ALBUMIN: 4.6 g/dL (ref 3.5–5.0)
ALT: 25 U/L (ref 0–44)
AST: 22 U/L (ref 15–41)
Alkaline Phosphatase: 76 U/L (ref 38–126)
Anion gap: 9 (ref 5–15)
BUN: 15 mg/dL (ref 6–20)
CO2: 25 mmol/L (ref 22–32)
Calcium: 9.2 mg/dL (ref 8.9–10.3)
Chloride: 108 mmol/L (ref 98–111)
Creatinine, Ser: 0.84 mg/dL (ref 0.44–1.00)
GFR calc Af Amer: >60 mL/min (ref >60)
GFR calc non Af Amer: >60 mL/min (ref >60)
Glucose, Bld: 104 mg/dL — ABNORMAL HIGH (ref 70–99)
Potassium: 3.3 mmol/L — ABNORMAL LOW (ref 3.5–5.1)
SODIUM: 142 mmol/L (ref 135–145)
Total Bilirubin: 0.6 mg/dL (ref 0.3–1.2)
Total Protein: 7.9 g/dL (ref 6.5–8.1)

## 2018-12-29 LAB — CBC WITH DIFFERENTIAL/PLATELET
Abs Immature Granulocytes: 0.02 10*3/uL (ref 0.00–0.07)
Basophils Absolute: 0 10*3/uL (ref 0.0–0.1)
Basophils Relative: 0 %
Eosinophils Absolute: 0.3 10*3/uL (ref 0.0–0.5)
Eosinophils Relative: 4 %
HCT: 44.6 % (ref 36.0–46.0)
Hemoglobin: 14.8 g/dL (ref 12.0–15.0)
Immature Granulocytes: 0 %
LYMPHS ABS: 3.3 10*3/uL (ref 0.7–4.0)
Lymphocytes Relative: 34 %
MCH: 29.7 pg (ref 26.0–34.0)
MCHC: 33.2 g/dL (ref 30.0–36.0)
MCV: 89.4 fL (ref 80.0–100.0)
Monocytes Absolute: 0.6 10*3/uL (ref 0.1–1.0)
Monocytes Relative: 6 %
Neutro Abs: 5.5 10*3/uL (ref 1.7–7.7)
Neutrophils Relative %: 56 %
Platelets: 302 10*3/uL (ref 150–400)
RBC: 4.99 MIL/uL (ref 3.87–5.11)
RDW: 12.2 % (ref 11.5–15.5)
WBC: 9.7 10*3/uL (ref 4.0–10.5)
nRBC: 0 % (ref 0.0–0.2)

## 2018-12-29 LAB — LIPASE, BLOOD: Lipase: 39 U/L (ref 11–51)

## 2018-12-29 LAB — POCT PREGNANCY, URINE: PREG TEST UR: NEGATIVE

## 2018-12-29 NOTE — ED Notes (Signed)
Pt states that lower abdominal pain started last night, last abt 10-15 minutes and then went away. Pain started again tonight lasting about 3 hours along with back pain and nausea. Pt alert and oriented x 4.

## 2018-12-29 NOTE — ED Provider Notes (Signed)
[facility] Emergency Department Provider Note  ____________________________________________   First MD Initiated Contact with Patient 12/29/18 [phone]     (approximate)  I have reviewed the triage vital signs and the nursing notes.   HISTORY  Chief Complaint Abdominal Pain   HPI Katelyn Jefferson is a 33 y.o. female who is presenting to the emergency department today complaining of right lower quadrant abdominal pain.  She states that the pain last for several hours and is now improving.  States that the pain is been radiating into her back like "menstrual cramps."  Says that she is on her third day of her period.  Denies any discharge or concern for STDs.  Does not report nausea, vomiting or diarrhea.  No history of kidney stones.  No history of ovarian cysts.  Says that the pain has been severe for several hours and is now decreased and almost nonexistent.   Past Medical History:  Diagnosis Date  . Abdominal mass   . Dysrhythmia   . Heart murmur    Congential heart murmur  . Miscarriage     Patient Active Problem List   Diagnosis Date Noted  . Postoperative state 02/28/2016  . Labor and delivery indication for care or intervention 01/25/2016  . Decreased fetal movement 10/27/2015  . 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 History of poor fetal growth 08/31/2015    Past Surgical History:  Procedure Laterality Date  . CESAREAN SECTION N/A 02/28/2016   Procedure: CESAREAN SECTION;  Surgeon: Suzy Bouchardhomas J Schermerhorn, MD;  Location: [facility] ORS;  Service: Obstetrics;  Laterality: N/A;  . CESAREAN SECTION  2010  . COLONOSCOPY  33 years old  . 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 TUBAL LIGATION Bilateral 02/28/2016   Procedure: BILATERAL TUBAL LIGATION;  Surgeon: Suzy Bouchardhomas J Schermerhorn, MD;  Location: [facility] ORS;   Service: Obstetrics;  Laterality: Bilateral;  . WISDOM TOOTH EXTRACTION      Prior to Admission medications   Medication Sig Start Date End Date Taking? Authorizing Provider  ibuprofen (ADVIL,MOTRIN) 100 MG/5ML suspension Take 300 mg by mouth every 4 (four) hours as needed for mild pain.    [provider]    Allergies Cephalexin; Sulfa antibiotics; and Sulfur  Family History  Problem Relation Age of Onset  . Stroke Mother   . Cancer Paternal Grandfather     Social History Social History   Tobacco Use  . Smoking status: Current Every Day Smoker    Packs/day: 0.50    Years: 10.00    Pack years: 5.00    Types: Cigarettes  . Smokeless tobacco: Never Used  Substance Use Topics  . Alcohol use: No  . Drug use: No    Review of Systems  Constitutional: No fever/chills Eyes: No visual changes. ENT: No sore throat. Cardiovascular: Denies chest pain. Respiratory: Denies shortness of breath. Gastrointestinal:  no vomiting.  No diarrhea.  No constipation. Genitourinary: As above Musculoskeletal: Negative for back pain. Skin: Negative for rash. Neurological: Negative for headaches, focal weakness or numbness.   ____________________________________________   PHYSICAL EXAM:  VITAL SIGNS: ED Triage Vitals  Enc Vitals Group     BP 12/29/18 0109 (!) 111/91     Pulse Rate 12/29/18 0109 87     Resp 12/29/18 0109 20     Temp 12/29/18 0109 98.6 F (37 C)  Temp Source 12/29/18 0109 Oral     SpO2 12/29/18 0109 100 %     Weight 12/29/18 0044 154 lb 15.7 oz (70.3 kg)     Height 12/29/18 0044 5\' 5"  (1.651 m)     Head Circumference --      Peak Flow --      Pain Score 12/29/18 0042 9     Pain Loc --      Pain Edu? --      Excl. in GC? --     Constitutional: Alert and oriented. Well appearing and in no acute distress. Eyes: Conjunctivae are normal.  Head: Atraumatic. Nose: No congestion/rhinnorhea. Mouth/Throat: Mucous membranes are moist.  Neck: No stridor.    Cardiovascular: Normal rate, regular rhythm. Grossly normal heart sounds.   Respiratory: Normal respiratory effort.  No retractions. Lungs CTAB. Gastrointestinal: Soft with mild right lower quadrant tenderness to palpation without rebound or guarding. No distention. No CVA tenderness. Musculoskeletal: No lower extremity tenderness nor edema.  No joint effusions. Neurologic:  Normal speech and language. No gross focal neurologic deficits are appreciated. Skin:  Skin is warm, dry and intact. No rash noted. Psychiatric: Mood and affect are normal. Speech and behavior are normal.  ____________________________________________   LABS (all labs ordered are listed, but only abnormal results are displayed)  Labs Reviewed  COMPREHENSIVE METABOLIC PANEL - Abnormal; Notable for the following components:      Result Value   Potassium 3.3 (*)    Glucose, Bld 104 (*)    All other components within normal limits  URINALYSIS, COMPLETE (UACMP) WITH MICROSCOPIC - Abnormal; Notable for the following components:   Color, Urine YELLOW (*)    APPearance CLEAR (*)    Hgb urine dipstick LARGE (*)    All other components within normal limits  CBC WITH DIFFERENTIAL/PLATELET  LIPASE, BLOOD  POCT PREGNANCY, URINE   ____________________________________________  EKG   ____________________________________________  RADIOLOGY  CT of the abdomen and pelvis without evidence for nephrolithiasis or obstructive uropathy.  No other acute intra-abdominal processes.  Positive for cholelithiasis. ____________________________________________   PROCEDURES  Procedure(s) performed:   Procedures  Critical Care performed:   ____________________________________________   INITIAL IMPRESSION / ASSESSMENT AND PLAN / ED COURSE  Pertinent labs & imaging results that were available during my care of the patient were reviewed by me and considered in my medical decision making (see chart for  details).  Differential diagnosis includes, but is not limited to, ovarian cyst, ovarian torsion, acute appendicitis, diverticulitis, urinary tract infection/pyelonephritis, endometriosis, bowel obstruction, colitis, renal colic, gastroenteritis, hernia, fibroids, endometriosis, pregnancy related pain including ectopic pregnancy, etc. As part of my medical decision making, I reviewed the following data within the electronic MEDICAL RECORD NUMBER Notes from prior ED visits  ----------------------------------------- 5:33 AM on 12/29/2018 -----------------------------------------  Patient without any distress at this time, resting comfortably in the bed.  Discussed CT results including the cholelithiasis.  Reexamined the abdomen and she is soft nontender throughout.  Negative Murphy sign.  Recommended follow-up with surgeon in the office and return for any worsening or concerning symptoms.  Patient is understanding of the diagnosis well treatment and willing to comply.  Deferred pelvic exam secondary to no reports of vaginal discharge and patient with low concern for STDs. ____________________________________________   FINAL CLINICAL IMPRESSION(S) / ED DIAGNOSES  Cholelithiasis.  Abdominal pain.  NEW MEDICATIONS STARTED DURING THIS VISIT:  New Prescriptions   No medications on file     Note:  This document was prepared  using Conservation officer, historic buildingsDragon voice recognition software and may include unintentional dictation errors.     Myrna BlazerSchaevitz, David Matthew, MD 12/29/18 [phone]

## 2018-12-29 NOTE — ED Triage Notes (Signed)
Pt to triage via w/c with no distress noted, brought in by EMS; reports tonight having mid lower abd pain radiating into back "like menstrual cramps"; denies accomp symptoms;

## 2018-12-31 ENCOUNTER — Encounter: Payer: Self-pay | Admitting: Surgery

## 2018-12-31 ENCOUNTER — Encounter: Payer: Self-pay | Admitting: *Deleted

## 2018-12-31 ENCOUNTER — Ambulatory Visit (INDEPENDENT_AMBULATORY_CARE_PROVIDER_SITE_OTHER): Payer: Medicaid Other | Admitting: Surgery

## 2018-12-31 ENCOUNTER — Other Ambulatory Visit: Payer: Self-pay

## 2018-12-31 VITALS — Temp 97.7°F | Ht 65.0 in | Wt 145.0 lb

## 2018-12-31 DIAGNOSIS — K8020 Calculus of gallbladder without cholecystitis without obstruction: Secondary | ICD-10-CM | POA: Insufficient documentation

## 2018-12-31 HISTORY — DX: Calculus of gallbladder without cholecystitis without obstruction: K80.20

## 2018-12-31 NOTE — Progress Notes (Signed)
Patient's [facility] to [facility] with Dr. Earlene Plateravis.   The patient is aware she will need to Pre-Admit. Patient will check in at [facility], [address] (first floor). Patient will be contacted with date and time [facility].   The patient is aware to call the office should she have further questions.

## 2018-12-31 NOTE — H&P (View-Only) (Signed)
Surgical Clinic History and Physical  Referring provider:  [place] [address] [place] [address] [place], [place] [address]  HISTORY OF PRESENT ILLNESS (HPI):  32 y.o. female presents for evaluation of RUQ abdominal pain and loose BM's x 1 month, for which she recently presented 2 days ago (1/21) to [facility] ED for evaluation. Patient reports she hasn't eaten many fruits or vegetables since she was a child and eats mostly meats, cheeses, dairy, and fried foods. Accordingly, her RUQ abdominal pain has been worse after anything she eats most meals, excluding water, which she says she's recently been trying to drink more. She reports nausea frequently occurs with her pain, though she denies frequent heartburn/GERD, emesis, blood per rectum, fever/chills, CP, or SOB. She also reports ED physician and others have declined any further workup while her gallbladder can explain many of her symptoms.  PAST MEDICAL HISTORY (PMH):  Past Medical History:  Diagnosis Date  . Abdominal mass   . Dysrhythmia   . Heart murmur    Congential heart murmur  . Miscarriage     PAST SURGICAL HISTORY (PSH):  Past Surgical History:  Procedure Laterality Date  . CESAREAN SECTION N/A 02/28/2016   Procedure: CESAREAN SECTION;  Surgeon: Thomas J Schermerhorn, MD;  Location: [facility] ORS;  Service: Obstetrics;  Laterality: N/A;  . CESAREAN SECTION  2010  . COLONOSCOPY  33 years old  . 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   Procedure: EXCISION OF ABDOMINAL WALL MASS;  Surgeon: Sankar, Seeplaputhur G, MD;  Location: [facility] ORS;  Service: General;  Laterality: N/A;  . TUBAL LIGATION Bilateral 02/28/2016   Procedure: BILATERAL TUBAL LIGATION;  Surgeon: Thomas J Schermerhorn, MD;  Location: [facility] ORS;  Service: Obstetrics;  Laterality: Bilateral;  . WISDOM TOOTH EXTRACTION      MEDICATIONS:  Prior to Admission medications   Medication Sig Start Date End Date Taking? Authorizing Provider  ibuprofen (ADVIL,MOTRIN) 100  MG/5ML suspension Take 300 mg by mouth every 4 (four) hours as needed for mild pain.   Yes [provider]  sertraline (ZOLOFT) 100 MG tablet Take 100 mg by mouth daily.   Yes [provider]    ALLERGIES:  Allergies  Allergen Reactions  . Cephalexin Other (See Comments)    Unknown  . Sulfa Antibiotics Hives  . Sulfur Hives    SOCIAL HISTORY:  Social History   Socioeconomic History  . Marital status: Divorced    Spouse name: Not on file  . Number of children: Not on file  . Years of education: Not on file  . Highest education level: Not on file  Occupational History  . Not on file  Social Needs  . Financial resource strain: Not on file  . Food insecurity:    Worry: Not on file    Inability: Not on file  . Transportation needs:    Medical: Not on file    Non-medical: Not on file  Tobacco Use  . Smoking status: Current Every Day Smoker    Packs/day: 0.50    Years: 10.00    Pack years: 5.00    Types: Cigarettes  . Smokeless tobacco: Never Used  Substance and Sexual Activity  . Alcohol use: No  . Drug use: No  . Sexual activity: Yes    Birth control/protection: Surgical  Lifestyle  . Physical activity:    Days per week: Not on file    Minutes per session: Not on file  . Stress: Not on file  Relationships  .   Social connections:    Talks on phone: Not on file    Gets together: Not on file    Attends religious service: Not on file    Active member of club or organization: Not on file    Attends meetings of clubs or organizations: Not on file    Relationship status: Not on file  . Intimate partner violence:    Fear of current or ex partner: Not on file    Emotionally abused: Not on file    Physically abused: Not on file    Forced sexual activity: Not on file  Other Topics Concern  . Not on file  Social History Narrative  . Not on file    The patient currently resides (home / rehab facility / nursing home): Home The patient normally is  (ambulatory / bedbound): Ambulatory  FAMILY HISTORY:  Family History  Problem Relation Age of Onset  . Stroke Mother   . Cancer Paternal Grandfather     Otherwise negative/non-contributory.  REVIEW OF SYSTEMS:  Constitutional: denies any other weight loss, fever, chills, or sweats  Eyes: denies any other vision changes, history of eye injury  ENT: denies sore throat, hearing problems  Respiratory: denies shortness of breath, wheezing  Cardiovascular: denies chest pain, palpitations  Gastrointestinal: abdominal pain, N/V, and bowel function as per HPI Musculoskeletal: denies any other joint pains or cramps  Skin: Denies any other rashes or skin discolorations Neurological: denies any other headache, dizziness, weakness  Psychiatric: Denies any other depression, anxiety   All other review of systems were otherwise negative   VITAL SIGNS:  Temp 97.7 F (36.5 C) (Skin)   Ht 5' 5" (1.651 m)   Wt 145 lb (65.8 kg)   LMP 12/29/2018 (Exact Date)   SpO2 98%   BMI 24.13 kg/m   PHYSICAL EXAM:  Constitutional:  -- Normal body habitus  -- Awake, alert, and oriented x3  Eyes:  -- Pupils equally round and reactive to light  -- No scleral icterus  Ear, nose, throat:  -- No jugular venous distension -- No nasal drainage, bleeding Pulmonary:  -- No crackles  -- Equal breath sounds bilaterally -- Breathing non-labored at rest Cardiovascular:  -- S1, S2 present  -- No pericardial rubs  Gastrointestinal:  -- Abdomen soft and non-distended with mild-/moderate- RUQ tenderness to palpation, no guarding/rebound tenderness -- No abdominal masses appreciated, pulsatile or otherwise  Musculoskeletal and Integumentary:  -- Wounds or skin discoloration: None appreciated -- Extremities: B/L UE and LE FROM, hands and feet warm, no edema  Neurologic:  -- Motor function: Intact and symmetric -- Sensation: Intact and symmetric  Labs:  CBC Latest Ref Rng & Units 12/29/2018 11/17/2018  11/08/2018  WBC 4.0 - 10.5 K/uL 9.7 10.2 8.6  Hemoglobin 12.0 - 15.0 g/dL 14.8 14.6 14.5  Hematocrit 36.0 - 46.0 % 44.6 43.5 42.2  Platelets 150 - 400 K/uL 302 268 284   CMP Latest Ref Rng & Units 12/29/2018 11/17/2018 11/08/2018  Glucose 70 - 99 mg/dL 104(H) 98 117(H)  BUN 6 - 20 mg/dL 15 13 16  Creatinine 0.44 - 1.00 mg/dL 0.84 0.76 0.71  Sodium 135 - 145 mmol/L 142 137 141  Potassium 3.5 - 5.1 mmol/L 3.3(L) 3.2(L) 3.4(L)  Chloride 98 - 111 mmol/L 108 106 109  CO2 22 - 32 mmol/L 25 23 24  Calcium 8.9 - 10.3 mg/dL 9.2 9.6 8.8(L)  Total Protein 6.5 - 8.1 g/dL 7.9 7.6 7.3  Total Bilirubin 0.3 - 1.2 mg/dL 0.6   0.4 0.4  Alkaline Phos 38 - 126 U/L 76 59 55  AST 15 - 41 U/L 22 24 22  ALT 0 - 44 U/L 25 20 16   Imaging studies:  CT Abdomen and Pelvis without Contrast (12/29/2018) 1. Cholelithiasis. 2. No CT evidence for nephrolithiasis or obstructive uropathy. 3. No other acute intra-abdominal or pelvic process.  Assessment/Plan:  32 y.o. female with likely symptomatic cholelithiasis and workup limited to/confounded by cholelithiasis, complicated by chronic ongoing tobacco abuse (smoking).              - avoid/minimize foods with higher fat content (meats, cheeses/dairy, and fried)             - prefer low-fat vegetables, whole grains (wheat bread, ceareals, etc), and fruits until cholecystectomy              - all risks, benefits, and alternatives to cholecystectomy were discussed with the patient, as well as possibility that cholecystectomy may be as much diagnostic as well as potentially therapeutic considering the spectrum of her symptoms and may help resolve some rather than all of her symptoms, all of her questions were answered to patient's expressed satisfaction, patient expresses she wishes to proceed, and informed consent was obtained accordingly.             - will plan for laparoscopic cholecystectomy per patient request pending anesthesia and OR availability             -  anticipate return to clinic 2 weeks after above planned surgery             - instructed to call if any questions or concerns  All of the above recommendations were discussed with the patient, and all of patient's questions were answered to her expressed satisfaction.  Thank you for the opportunity to participate in this patient's care.  -- Ballard Budney E. Trenda Corliss, MD, RPVI [place]: [facility] General Surgery - Partnering for exceptional care. Office: [phone] 

## 2018-12-31 NOTE — Patient Instructions (Signed)

## 2018-12-31 NOTE — Progress Notes (Signed)
Surgical Clinic History and Physical  Referring provider:  [facility] [address] [place], [place] [address]  HISTORY OF PRESENT ILLNESS (HPI):  33 y.o. female presents for evaluation of RUQ abdominal pain and loose BM's x 1 month, for which she recently presented 2 days ago (1/21) to [facility] ED for evaluation. Patient reports she hasn't eaten many fruits or vegetables since she was a child and eats mostly meats, cheeses, dairy, and fried foods. Accordingly, her RUQ abdominal pain has been worse after anything she eats most meals, excluding water, which she says she's recently been trying to drink more. She reports nausea frequently occurs with her pain, though she denies frequent heartburn/GERD, emesis, blood per rectum, fever/chills, CP, or SOB. She also reports ED physician and others have declined any further workup while her gallbladder can explain many of her symptoms.  PAST MEDICAL HISTORY (PMH):  Past Medical History:  Diagnosis Date  . Abdominal mass   . Dysrhythmia   . Heart murmur    Congential heart murmur  . Miscarriage     PAST SURGICAL HISTORY [facility]):  Past Surgical History:  Procedure Laterality Date  . CESAREAN SECTION N/A 02/28/2016   Procedure: CESAREAN SECTION;  Surgeon: Suzy Bouchard, MD;  Location: [facility] ORS;  Service: Obstetrics;  Laterality: N/A;  . CESAREAN SECTION  2010  . COLONOSCOPY  32 years old  . 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 TUBAL LIGATION Bilateral 02/28/2016   Procedure: BILATERAL TUBAL LIGATION;  Surgeon: Suzy Bouchard, MD;  Location: [facility] ORS;  Service: Obstetrics;  Laterality: Bilateral;  . WISDOM TOOTH EXTRACTION      MEDICATIONS:  Prior to Admission medications   Medication Sig Start Date End Date Taking? Authorizing Provider  ibuprofen (ADVIL,MOTRIN) 100  MG/5ML suspension Take 300 mg by mouth every 4 (four) hours as needed for mild pain.   Yes [provider]  sertraline (ZOLOFT) 100 MG tablet Take 100 mg by mouth daily.   Yes [provider]    ALLERGIES:  Allergies  Allergen Reactions  . Cephalexin Other (See Comments)    Unknown  . Sulfa Antibiotics Hives  . Sulfur Hives    SOCIAL HISTORY:  Social History   Socioeconomic History  . Marital status: Divorced    Spouse name: Not on file  . Number of children: Not on file  . Years of education: Not on file  . Highest education level: Not on file  Occupational History  . Not on file  Social Needs  . Financial resource strain: Not on file  . Food insecurity:    Worry: Not on file    Inability: Not on file  . Transportation needs:    Medical: Not on file    Non-medical: Not on file  Tobacco Use  . Smoking status: Current Every Day Smoker    Packs/day: 0.50    Years: 10.00    Pack years: 5.00    Types: Cigarettes  . Smokeless tobacco: Never Used  Substance and Sexual Activity  . Alcohol use: No  . Drug use: No  . Sexual activity: Yes    Birth control/protection: Surgical  Lifestyle  . Physical activity:    Days per week: Not on file    Minutes per session: Not on file  . Stress: Not on file  Relationships  .  Social connections:    Talks on phone: Not on file    Gets together: Not on file    Attends religious service: Not on file    Active member of club or organization: Not on file    Attends meetings of clubs or organizations: Not on file    Relationship status: Not on file  . Intimate partner violence:    Fear of current or ex partner: Not on file    Emotionally abused: Not on file    Physically abused: Not on file    Forced sexual activity: Not on file  Other Topics Concern  . Not on file  Social History Narrative  . Not on file    The patient currently resides (home / rehab facility / nursing home): Home The patient normally is  (ambulatory / bedbound): Ambulatory  FAMILY HISTORY:  Family History  Problem Relation Age of Onset  . Stroke Mother   . Cancer Paternal Grandfather     Otherwise negative/non-contributory.  REVIEW OF SYSTEMS:  Constitutional: denies any other weight loss, fever, chills, or sweats  Eyes: denies any other vision changes, history of eye injury  ENT: denies sore throat, hearing problems  Respiratory: denies shortness of breath, wheezing  Cardiovascular: denies chest pain, palpitations  Gastrointestinal: abdominal pain, N/V, and bowel function as per HPI Musculoskeletal: denies any other joint pains or cramps  Skin: Denies any other rashes or skin discolorations Neurological: denies any other headache, dizziness, weakness  Psychiatric: Denies any other depression, anxiety   All other review of systems were otherwise negative   VITAL SIGNS:  Temp 97.7 F (36.5 C) (Skin)   Ht 5\' 5"  (1.651 m)   Wt 145 lb (65.8 kg)   LMP 12/29/2018 (Exact Date)   SpO2 98%   BMI 24.13 kg/m   PHYSICAL EXAM:  Constitutional:  -- Normal body habitus  -- Awake, alert, and oriented x3  Eyes:  -- Pupils equally round and reactive to light  -- No scleral icterus  Ear, nose, throat:  -- No jugular venous distension -- No nasal drainage, bleeding Pulmonary:  -- No crackles  -- Equal breath sounds bilaterally -- Breathing non-labored at rest Cardiovascular:  -- S1, S2 present  -- No pericardial rubs  Gastrointestinal:  -- Abdomen soft and non-distended with mild-/moderate- RUQ tenderness to palpation, no guarding/rebound tenderness -- No abdominal masses appreciated, pulsatile or otherwise  Musculoskeletal and Integumentary:  -- Wounds or skin discoloration: None appreciated -- Extremities: B/L UE and LE FROM, hands and feet warm, no edema  Neurologic:  -- Motor function: Intact and symmetric -- Sensation: Intact and symmetric  Labs:  CBC Latest Ref Rng & Units 12/29/2018 11/17/2018  11/08/2018  WBC 4.0 - 10.5 K/uL 9.7 10.2 8.6  Hemoglobin 12.0 - 15.0 g/dL 16.114.8 09.614.6 04.514.5  Hematocrit 36.0 - 46.0 % 44.6 43.5 42.2  Platelets 150 - 400 K/uL 302 268 284   CMP Latest Ref Rng & Units 12/29/2018 11/17/2018 11/08/2018  Glucose 70 - 99 mg/dL 409(W104(H) 98 119(J117(H)  BUN 6 - 20 mg/dL 15 13 16   Creatinine 0.44 - 1.00 mg/dL 4.780.84 2.950.76 6.210.71  Sodium 135 - 145 mmol/L 142 137 141  Potassium 3.5 - 5.1 mmol/L 3.3(L) 3.2(L) 3.4(L)  Chloride 98 - 111 mmol/L 108 106 109  CO2 22 - 32 mmol/L 25 23 24   Calcium 8.9 - 10.3 mg/dL 9.2 9.6 3.0(Q8.8(L)  Total Protein 6.5 - 8.1 g/dL 7.9 7.6 7.3  Total Bilirubin 0.3 - 1.2 mg/dL 0.6  0.4 0.4  Alkaline Phos 38 - 126 U/L 76 59 55  AST 15 - 41 U/L 22 24 22   ALT 0 - 44 U/L 25 20 16    Imaging studies:  CT Abdomen and Pelvis without Contrast (12/29/2018) 1. Cholelithiasis. 2. No CT evidence for nephrolithiasis or obstructive uropathy. 3. No other acute intra-abdominal or pelvic process.  Assessment/Plan:  32 y.o. female with likely symptomatic cholelithiasis and workup limited to/confounded by cholelithiasis, complicated by chronic ongoing tobacco abuse (smoking).              - avoid/minimize foods with higher fat content (meats, cheeses/dairy, and fried)             - prefer low-fat vegetables, whole grains (wheat bread, ceareals, etc), and fruits until cholecystectomy              - all risks, benefits, and alternatives to cholecystectomy were discussed with the patient, as well as possibility that cholecystectomy may be as much diagnostic as well as potentially therapeutic considering the spectrum of her symptoms and may help resolve some rather than all of her symptoms, all of her questions were answered to patient's expressed satisfaction, patient expresses she wishes to proceed, and informed consent was obtained accordingly.             - will plan for laparoscopic cholecystectomy per patient request pending anesthesia and OR availability             -  anticipate return to clinic 2 weeks after above planned surgery             - instructed to call if any questions or concerns  All of the above recommendations were discussed with the patient, and all of patient's questions were answered to her expressed satisfaction.  Thank you for the opportunity to participate in this patient's care.  -- Scherrie Gerlach Earlene Plater, MD, RPVI [place]: [facility] - Partnering for exceptional care. Office: [phone]

## 2019-01-04 ENCOUNTER — Telehealth: Payer: Self-pay | Admitting: *Deleted

## 2019-01-04 NOTE — Telephone Encounter (Signed)
Patient was contacted today and surgery date was confirmed- 01-15-19.  The patient was also notified of Pre-admit appointment scheduled for 01-08-19 at 9:30 am.  Patient verbalizes understanding.

## 2019-01-08 ENCOUNTER — Other Ambulatory Visit: Payer: Self-pay

## 2019-01-08 ENCOUNTER — Telehealth: Payer: Self-pay

## 2019-01-08 ENCOUNTER — Encounter
Admission: RE | Admit: 2019-01-08 | Discharge: 2019-01-08 | Disposition: A | Discharge status: Home / Self Care | Payer: Medicaid Other | Source: Ambulatory Visit | Attending: Surgery | Admitting: Surgery

## 2019-01-08 DIAGNOSIS — Z01812 Encounter for preprocedural laboratory examination: Secondary | ICD-10-CM | POA: Diagnosis present

## 2019-01-08 HISTORY — DX: Anxiety disorder, unspecified: F41.9

## 2019-01-08 MED ORDER — POTASSIUM CHLORIDE CRYS ER 20 MEQ PO TBCR: 20.0000 meq | EXTENDED_RELEASE_TABLET | Freq: Two times a day (BID) | ORAL | 0 refills | Status: DC

## 2019-01-08 NOTE — Patient Instructions (Signed)
Your procedure is scheduled GE:[id number] 01/15/19 Report to Day Surgery. To find out your arrival time please call [phone] between 1PM - 3PM on Thurs 01/14/19.  Remember: Instructions that are not followed completely may result in serious medical risk,  up to and including death, or upon the discretion of your surgeon and anesthesiologist your  surgery may need to be rescheduled.     _X__ 1. Do not eat food after midnight the night before your procedure.                 No gum chewing or hard candies. You may drink clear liquids up to 2 hours                 before you are scheduled to arrive for your surgery- DO not drink clear                 liquids within 2 hours of the start of your surgery.                 Clear Liquids include:  water, apple juice without pulp, clear carbohydrate                 drink such as Clearfast of Gatorade, Black Coffee or Tea (Do not add                 anything to coffee or tea).  __X__2.  On the morning of surgery brush your teeth with toothpaste and water, you                may rinse your mouth with mouthwash if you wish.  Do not swallow any toothpaste of mouthwash.     _X__ 3.  No Alcohol for 24 hours before or after surgery.   _X__ 4.  Do Not Smoke or use e-cigarettes For 24 Hours Prior to Your Surgery.                 Do not use any chewable tobacco products for at least 6 hours prior to                 surgery.  ____  5.  Bring all medications with you on the day of surgery if instructed.   __x__  6.  Notify your doctor if there is any change in your medical condition      (cold, fever, infections).     Do not wear jewelry, make-up, hairpins, clips or nail polish. Do not wear lotions, powders, or perfumes. You may wear deodorant. Do not shave 48 hours prior to surgery. Men may shave face and neck. Do not bring valuables to the hospital.    [facility] is not responsible for any belongings or valuables.  Contacts,  dentures or bridgework may not be worn into surgery. Leave your suitcase in the car. After surgery it may be brought to your room. For patients admitted to the hospital, discharge time is determined by your treatment team.   Patients discharged the day of surgery will not be allowed to drive home.   Please read over the following fact sheets that you were given:     __x__ Take these medicines the morning of surgery with A SIP OF WATER:    1. hydrOXYzine (ATARAX/VISTARIL) 25 MG tablet  2.   3.   4.  5.  6.  ____ Fleet Enema (as directed)   __x__ Use CHG Soap as directed  ____ Use inhalers  on the day of surgery  ____ Stop metformin 2 days prior to surgery    ____ Take 1/2 of usual insulin dose the night before surgery. No insulin the morning          of surgery.   ____ Stop Coumadin/Plavix/aspirin on   __x__ Stop Anti-inflammatories on ibuprofen aleve  May tylenol   ____ Stop supplements until after surgery.    ____ Bring C-Pap to the hospital.

## 2019-01-08 NOTE — Pre-Procedure Instructions (Signed)
Notified Dr. Earlene Plater about 3.3 K.  Office will contact patient.  Will repeat AM of surgery.

## 2019-01-08 NOTE — Telephone Encounter (Signed)
Per Dr.Davis Potassium level 3.2 follow office protocol.  Potassium BID x 5 days sent to pharmacy.   Left message on patients VM as well as her mothers VM stating the above.

## 2019-01-14 ENCOUNTER — Telehealth: Payer: Self-pay | Admitting: *Deleted

## 2019-01-14 MED ORDER — LACTATED RINGERS IV SOLN
INTRAVENOUS | Status: DC
  Administered 2019-01-15: 07:00:00 via INTRAVENOUS

## 2019-01-14 MED ORDER — FAMOTIDINE 20 MG PO TABS
20.0000 mg | ORAL_TABLET | Freq: Once | ORAL | Status: AC
  Administered 2019-01-15: 20 mg via ORAL

## 2019-01-14 MED ORDER — ACETAMINOPHEN 500 MG PO TABS: 1000.0000 mg | ORAL_TABLET | ORAL | Status: DC

## 2019-01-14 MED ORDER — CEFAZOLIN SODIUM-DEXTROSE 2-4 GM/100ML-% IV SOLN
2.0000 g | INTRAVENOUS | Status: AC
  Administered 2019-01-15: 2 g via INTRAVENOUS

## 2019-01-14 NOTE — Telephone Encounter (Signed)
She called complaining of a dull ache right shoulder blade area that started last night and has increased since. Unable to set comfortable. She wants to know if she needs to go to the ED. Lower abd bloating no BM today. Nausea but no vomiting. No fever or chills. She has not tried and OTC products. Dr Earlene Plater recommends OTC product of choice for pain increase fluid intake stay hydrated and miralax to help stimulate BM if constipated. She will try and call back around 4 with status update.

## 2019-01-15 ENCOUNTER — Ambulatory Visit: Payer: Medicaid Other | Admitting: Anesthesiology

## 2019-01-15 ENCOUNTER — Other Ambulatory Visit: Payer: Self-pay

## 2019-01-15 ENCOUNTER — Ambulatory Visit
Admission: RE | Admit: 2019-01-15 | Discharge: 2019-01-15 | Disposition: A | Discharge status: Home / Self Care | Payer: Medicaid Other | Attending: Surgery | Admitting: Surgery

## 2019-01-15 ENCOUNTER — Encounter: Payer: Self-pay | Admitting: *Deleted

## 2019-01-15 ENCOUNTER — Encounter
Admission: RE | Disposition: A | Discharge status: Home / Self Care | Payer: Self-pay | Source: Home / Self Care | Attending: Surgery

## 2019-01-15 DIAGNOSIS — K8010 Calculus of gallbladder with chronic cholecystitis without obstruction: Secondary | ICD-10-CM | POA: Insufficient documentation

## 2019-01-15 DIAGNOSIS — F17210 Nicotine dependence, cigarettes, uncomplicated: Secondary | ICD-10-CM | POA: Insufficient documentation

## 2019-01-15 DIAGNOSIS — R1011 Right upper quadrant pain: Secondary | ICD-10-CM | POA: Diagnosis present

## 2019-01-15 DIAGNOSIS — K8020 Calculus of gallbladder without cholecystitis without obstruction: Secondary | ICD-10-CM | POA: Diagnosis not present

## 2019-01-15 HISTORY — PX: CHOLECYSTECTOMY: SHX55

## 2019-01-15 LAB — POCT I-STAT 4, (NA,K, GLUC, HGB,HCT)
Glucose, Bld: 86 mg/dL (ref 70–99)
HCT: 42 % (ref 36.0–46.0)
Hemoglobin: 14.3 g/dL (ref 12.0–15.0)
Potassium: 3.7 mmol/L (ref 3.5–5.1)
Sodium: 143 mmol/L (ref 135–145)

## 2019-01-15 LAB — POCT PREGNANCY, URINE: Preg Test, Ur: NEGATIVE

## 2019-01-15 SURGERY — LAPAROSCOPIC CHOLECYSTECTOMY
Anesthesia: General

## 2019-01-15 MED ORDER — FENTANYL CITRATE (PF) 100 MCG/2ML IJ SOLN
INTRAMUSCULAR | Status: DC | PRN
  Administered 2019-01-15 (×3): 50 ug via INTRAVENOUS

## 2019-01-15 MED ORDER — SUGAMMADEX SODIUM 200 MG/2ML IV SOLN
INTRAVENOUS | Status: DC | PRN
  Administered 2019-01-15: 200 mg via INTRAVENOUS

## 2019-01-15 MED ORDER — PROPOFOL 10 MG/ML IV BOLUS
INTRAVENOUS | Status: AC
  Filled 2019-01-15: qty 20

## 2019-01-15 MED ORDER — CHLORHEXIDINE GLUCONATE CLOTH 2 % EX PADS: 6.0000 | MEDICATED_PAD | Freq: Once | CUTANEOUS | Status: DC

## 2019-01-15 MED ORDER — OXYCODONE HCL 5 MG PO TABS: 5.0000 mg | ORAL_TABLET | Freq: Once | ORAL | Status: DC | PRN

## 2019-01-15 MED ORDER — PROMETHAZINE HCL 25 MG/ML IJ SOLN
6.2500 mg | INTRAMUSCULAR | Status: DC | PRN
  Administered 2019-01-15: 6.25 mg via INTRAVENOUS

## 2019-01-15 MED ORDER — FENTANYL CITRATE (PF) 100 MCG/2ML IJ SOLN
INTRAMUSCULAR | Status: AC
  Filled 2019-01-15: qty 2

## 2019-01-15 MED ORDER — MIDAZOLAM HCL 2 MG/2ML IJ SOLN
INTRAMUSCULAR | Status: DC | PRN
  Administered 2019-01-15: 2 mg via INTRAVENOUS

## 2019-01-15 MED ORDER — LIDOCAINE HCL (CARDIAC) PF 100 MG/5ML IV SOSY
PREFILLED_SYRINGE | INTRAVENOUS | Status: DC | PRN
  Administered 2019-01-15: 80 mg via INTRAVENOUS

## 2019-01-15 MED ORDER — MEPERIDINE HCL 50 MG/ML IJ SOLN: 6.2500 mg | INTRAMUSCULAR | Status: DC | PRN

## 2019-01-15 MED ORDER — OXYCODONE HCL 5 MG/5ML PO SOLN: 5.0000 mg | Freq: Once | ORAL | Status: DC | PRN

## 2019-01-15 MED ORDER — OXYCODONE-ACETAMINOPHEN 5-325 MG PO TABS: 1.0000 | ORAL_TABLET | ORAL | 0 refills | Status: DC | PRN

## 2019-01-15 MED ORDER — ROCURONIUM BROMIDE 100 MG/10ML IV SOLN
INTRAVENOUS | Status: DC | PRN
  Administered 2019-01-15: 5 mg via INTRAVENOUS
  Administered 2019-01-15: 40 mg via INTRAVENOUS

## 2019-01-15 MED ORDER — DEXAMETHASONE SODIUM PHOSPHATE 10 MG/ML IJ SOLN
INTRAMUSCULAR | Status: DC | PRN
  Administered 2019-01-15: 10 mg via INTRAVENOUS

## 2019-01-15 MED ORDER — MIDAZOLAM HCL 2 MG/2ML IJ SOLN
INTRAMUSCULAR | Status: AC
  Filled 2019-01-15: qty 2

## 2019-01-15 MED ORDER — ACETAMINOPHEN 500 MG PO TABS
ORAL_TABLET | ORAL | Status: AC
  Filled 2019-01-15: qty 2

## 2019-01-15 MED ORDER — FAMOTIDINE 20 MG PO TABS
ORAL_TABLET | ORAL | Status: AC
  Administered 2019-01-15: 20 mg via ORAL
  Filled 2019-01-15: qty 1

## 2019-01-15 MED ORDER — DEXMEDETOMIDINE HCL 200 MCG/2ML IV SOLN
INTRAVENOUS | Status: DC | PRN
  Administered 2019-01-15: 8 ug via INTRAVENOUS

## 2019-01-15 MED ORDER — CEFAZOLIN SODIUM-DEXTROSE 2-4 GM/100ML-% IV SOLN
INTRAVENOUS | Status: AC
  Filled 2019-01-15: qty 100

## 2019-01-15 MED ORDER — PHENYLEPHRINE HCL 10 MG/ML IJ SOLN
INTRAMUSCULAR | Status: DC | PRN
  Administered 2019-01-15: 100 ug via INTRAVENOUS

## 2019-01-15 MED ORDER — LIDOCAINE HCL (PF) 1 % IJ SOLN
INTRAMUSCULAR | Status: AC
  Filled 2019-01-15: qty 30

## 2019-01-15 MED ORDER — LIDOCAINE HCL 1 % IJ SOLN
INTRAMUSCULAR | Status: DC | PRN
  Administered 2019-01-15: 20 mL via INTRAMUSCULAR

## 2019-01-15 MED ORDER — ONDANSETRON HCL 4 MG/2ML IJ SOLN
INTRAMUSCULAR | Status: DC | PRN
  Administered 2019-01-15: 4 mg via INTRAVENOUS

## 2019-01-15 MED ORDER — PROPOFOL 10 MG/ML IV BOLUS
INTRAVENOUS | Status: DC | PRN
  Administered 2019-01-15: 130 mg via INTRAVENOUS

## 2019-01-15 MED ORDER — PROMETHAZINE HCL 25 MG/ML IJ SOLN
INTRAMUSCULAR | Status: AC
  Filled 2019-01-15: qty 1

## 2019-01-15 MED ORDER — BUPIVACAINE HCL (PF) 0.5 % IJ SOLN
INTRAMUSCULAR | Status: AC
  Filled 2019-01-15: qty 30

## 2019-01-15 MED ORDER — FENTANYL CITRATE (PF) 100 MCG/2ML IJ SOLN
25.0000 ug | INTRAMUSCULAR | Status: DC | PRN
  Administered 2019-01-15: 25 ug via INTRAVENOUS

## 2019-01-15 SURGICAL SUPPLY — 37 items
APPLIER CLIP ROT 10 11.4 M/L (STAPLE) ×3
CHLORAPREP W/TINT 26ML (MISCELLANEOUS) ×3 IMPLANT
CLIP APPLIE ROT 10 11.4 M/L (STAPLE) ×1 IMPLANT
COVER WAND RF STERILE (DRAPES) ×1 IMPLANT
DECANTER SPIKE VIAL GLASS SM (MISCELLANEOUS) ×4 IMPLANT
DERMABOND ADVANCED (GAUZE/BANDAGES/DRESSINGS) ×2
DERMABOND ADVANCED .7 DNX12 (GAUZE/BANDAGES/DRESSINGS) ×1 IMPLANT
DRESSING SURGICEL FIBRLLR 1X2 (HEMOSTASIS) IMPLANT
DRSG SURGICEL FIBRILLAR 1X2 (HEMOSTASIS)
ELECT REM PT RETURN 9FT ADLT (ELECTROSURGICAL) ×3
ELECTRODE REM PT RTRN 9FT ADLT (ELECTROSURGICAL) ×1 IMPLANT
GLOVE BIO SURGEON STRL SZ7 (GLOVE) ×3 IMPLANT
GLOVE BIOGEL PI IND STRL 7.5 (GLOVE) ×1 IMPLANT
GLOVE BIOGEL PI INDICATOR 7.5 (GLOVE) ×2
GOWN STRL REUS W/ TWL LRG LVL3 (GOWN DISPOSABLE) ×3 IMPLANT
GOWN STRL REUS W/TWL LRG LVL3 (GOWN DISPOSABLE) ×6
GRASPER SUT TROCAR 14GX15 (MISCELLANEOUS) ×3 IMPLANT
IRRIGATION STRYKERFLOW (MISCELLANEOUS) IMPLANT
IRRIGATOR STRYKERFLOW (MISCELLANEOUS)
IV NS 1000ML (IV SOLUTION)
IV NS 1000ML BAXH (IV SOLUTION) IMPLANT
KIT TURNOVER KIT A (KITS) ×3 IMPLANT
LABEL OR SOLS (LABEL) ×3 IMPLANT
NDL INSUFFLATION 14GA 120MM (NEEDLE) ×1 IMPLANT
NEEDLE HYPO 22GX1.5 SAFETY (NEEDLE) ×3 IMPLANT
NEEDLE INSUFFLATION 14GA 120MM (NEEDLE) ×3 IMPLANT
NS IRRIG 1000ML POUR BTL (IV SOLUTION) ×3 IMPLANT
PACK LAP CHOLECYSTECTOMY (MISCELLANEOUS) ×3 IMPLANT
POUCH SPECIMEN RETRIEVAL 10MM (ENDOMECHANICALS) ×3 IMPLANT
SCISSORS METZENBAUM CVD 33 (INSTRUMENTS) ×3 IMPLANT
SET TUBE SMOKE EVAC HIGH FLOW (TUBING) ×3 IMPLANT
SLEEVE ENDOPATH XCEL 5M (ENDOMECHANICALS) ×6 IMPLANT
SUT MNCRL AB 4-0 PS2 18 (SUTURE) ×3 IMPLANT
SUT VICRYL 0 UR6 27IN ABS (SUTURE) ×3 IMPLANT
SUT VICRYL AB 3-0 FS1 BRD 27IN (SUTURE) ×3 IMPLANT
TROCAR XCEL 12X100 BLDLESS (ENDOMECHANICALS) ×3 IMPLANT
TROCAR XCEL NON-BLD 5MMX100MML (ENDOMECHANICALS) ×3 IMPLANT

## 2019-01-15 NOTE — Interval H&P Note (Signed)
History and Physical Interval Note:  01/15/2019 7:22 AM  Katelyn Jefferson  has presented today for surgery, with the diagnosis of SX CHOLE, GALLSTONES  The various methods of treatment have been discussed with the patient and family. After consideration of risks, benefits and other options for treatment, the patient has consented to  Procedure(s): LAPAROSCOPIC CHOLECYSTECTOMY (N/A) as a surgical intervention .  The patient's history has been reviewed, patient examined, no change in status, stable for surgery.  I have reviewed the patient's chart and labs.  Questions were answered to the patient's satisfaction.     Ancil LinseyJason Evan Jjesus Dingley

## 2019-01-15 NOTE — Anesthesia Postprocedure Evaluation (Signed)
Anesthesia Post Note  Patient: Magdaline L Hitz  Procedure(s) Performed: LAPAROSCOPIC CHOLECYSTECTOMY (N/A )  Patient location during evaluation: PACU Anesthesia Type: General Level of consciousness: awake and alert and oriented Pain management: pain level controlled Vital Signs Assessment: post-procedure vital signs reviewed and stable Respiratory status: spontaneous breathing, nonlabored ventilation and respiratory function stable Cardiovascular status: blood pressure returned to baseline and stable Postop Assessment: no signs of nausea or vomiting Anesthetic complications: no     Last Vitals:  Vitals:   01/15/19 1131 01/15/19 1148  BP: (!) 90/57 98/62  Pulse: 64 69  Resp:    Temp:    SpO2:      Last Pain:  Vitals:   01/15/19 1148  TempSrc:   PainSc: 3                  Naika Noto

## 2019-01-15 NOTE — Anesthesia Post-op Follow-up Note (Signed)
Anesthesia QCDR form completed.        

## 2019-01-15 NOTE — Transfer of Care (Signed)
Immediate Anesthesia Transfer of Care Note  Patient: Shalamar L Etzler  Procedure(s) Performed: LAPAROSCOPIC CHOLECYSTECTOMY (N/A )  Patient Location: PACU  Anesthesia Type:General  Level of Consciousness: drowsy  Airway & Oxygen Therapy: Patient Spontanous Breathing and Patient connected to face mask oxygen  Post-op Assessment: Report given to RN and Post -op Vital signs reviewed and stable  Post vital signs: stable  Last Vitals:  Vitals Value Taken Time  BP 115/79 01/15/2019  9:53 AM  Temp    Pulse 89 01/15/2019  9:56 AM  Resp 14 01/15/2019  9:56 AM  SpO2 100 % 01/15/2019  9:56 AM  Vitals shown include unvalidated device data.  Last Pain:  Vitals:   01/15/19 0622  TempSrc: Tympanic  PainSc: 0-No pain         Complications: No apparent anesthesia complications

## 2019-01-15 NOTE — Anesthesia Preprocedure Evaluation (Signed)
Anesthesia Evaluation  Patient identified by MRN, date of birth, ID band Patient awake    Reviewed: Allergy & Precautions, NPO status , Patient's Chart, lab work & pertinent test results  History of Anesthesia Complications Negative for: history of anesthetic complications  Airway Mallampati: II  TM Distance: >3 FB Neck ROM: Full    Dental no notable dental hx.    Pulmonary neg sleep apnea, neg COPD, Current Smoker,    breath sounds clear to auscultation- rhonchi (-) wheezing      Cardiovascular Exercise Tolerance: Good (-) hypertension(-) CAD, (-) Past MI, (-) Cardiac Stents and (-) CABG  Rhythm:Regular Rate:Normal - Systolic murmurs and - Diastolic murmurs    Neuro/Psych neg Seizures Anxiety negative neurological ROS     GI/Hepatic negative GI ROS, Neg liver ROS,   Endo/Other  neg diabetes  Renal/GU negative Renal ROS     Musculoskeletal negative musculoskeletal ROS (+)   Abdominal (+) - obese,   Peds  Hematology negative hematology ROS (+)   Anesthesia Other Findings Past Medical History: No date: Abdominal mass No date: Anxiety 10/27/2015: Decreased fetal movement No date: Dysrhythmia     Comment:  tachycardia No date: Heart murmur     Comment:  Congential heart murmur  No longer present 08/31/2015: History of poor fetal growth No date: Miscarriage 10/12/2015: Single umbilical artery   Reproductive/Obstetrics                             Anesthesia Physical Anesthesia Plan  ASA: II  Anesthesia Plan: General   Post-op Pain Management:    Induction: Intravenous  PONV Risk Score and Plan: 1 and Ondansetron, Dexamethasone and Midazolam  Airway Management Planned: Oral ETT  Additional Equipment:   Intra-op Plan:   Post-operative Plan: Extubation in OR  Informed Consent: I have reviewed the patients History and Physical, chart, labs and discussed the procedure including  the risks, benefits and alternatives for the proposed anesthesia with the patient or authorized representative who has indicated his/her understanding and acceptance.     Dental advisory given  Plan Discussed with: CRNA and Anesthesiologist  Anesthesia Plan Comments:         Anesthesia Quick Evaluation

## 2019-01-15 NOTE — Discharge Instructions (Addendum)
In addition to included general post-operative instructions for Laparoscopic Cholecystectomy,  Diet: Resume home heart healthy diet (may prefer to start with low fat foods as discussed).   Activity: No heavy lifting >15 - 20 pounds (children, pets, laundry, garbage) or strenuous activity until follow-up in 2 weeks, but light activity and walking are encouraged. Do not drive or drink alcohol if taking narcotic pain medications.  Wound care: 2 days after surgery (Sunday, 2/9), you may shower/get incision wet with soapy water and pat dry (do not rub incisions), but no baths or submerging incision underwater until follow-up.   Medications: Resume all home medications. For mild to moderate pain: acetaminophen (Tylenol) or ibuprofen/naproxen (if no kidney disease). Combining Tylenol with alcohol can substantially increase your risk of causing liver disease. Narcotic pain medications, if prescribed, can be used for severe pain, though may cause nausea, constipation, and drowsiness. Do not combine Tylenol and Percocet (or similar) within a 6 hour period as Percocet (and similar) contain(s) Tylenol. If you do not need the narcotic pain medication, you do not need to fill the prescription.  Call office [phone]) at any time if any questions, worsening pain, fevers/chills, bleeding, drainage from incision site, or other concerns.   Laparoscopic Cholecystectomy, Care After This sheet gives you information about how to care for yourself after your procedure. Your doctor may also give you more specific instructions. If you have problems or questions, contact your doctor. Follow these instructions at home: Care for cuts from surgery (incisions)   Follow instructions from your doctor about how to take care of your cuts from surgery. Make sure you: ? Wash your hands with soap and water before you change your bandage (dressing). If you cannot use soap and water, use hand sanitizer. ? Change your bandage as  told by your doctor. ? Leave stitches (sutures), skin glue, or skin tape (adhesive) strips in place. They may need to stay in place for 2 weeks or longer. If tape strips get loose and curl up, you may trim the loose edges. Do not remove tape strips completely unless your doctor says it is okay.  Do not take baths, swim, or use a hot tub until your doctor says it is okay. Ask your doctor if you can take showers. You may only be allowed to take sponge baths for bathing.  Check your surgical cut area every day for signs of infection. Check for: ? More redness, swelling, or pain. ? More fluid or blood. ? Warmth. ? Pus or a bad smell. Activity  Do not drive or use heavy machinery while taking prescription pain medicine.  Do not lift anything that is heavier than 10 lb (4.5 kg) until your doctor says it is okay.  Do not play contact sports until your doctor says it is okay.  Do not drive for 24 hours if you were given a medicine to help you relax (sedative).  Rest as needed. Do not return to work or school until your doctor says it is okay. General instructions  Take over-the-counter and prescription medicines only as told by your doctor.  To prevent or treat constipation while you are taking prescription pain medicine, your doctor may recommend that you: ? Drink enough fluid to keep your pee (urine) clear or pale yellow. ? Take over-the-counter or prescription medicines. ? Eat foods that are high in fiber, such as fresh fruits and vegetables, whole grains, and beans. ? Limit foods that are high in fat and processed sugars, such as fried  and sweet foods. Contact a doctor if:  You develop a rash.  You have more redness, swelling, or pain around your surgical cuts.  You have more fluid or blood coming from your surgical cuts.  Your surgical cuts feel warm to the touch.  You have pus or a bad smell coming from your surgical cuts.  You have a fever.  One or more of your surgical  cuts breaks open. Get help right away if:  You have trouble breathing.  You have chest pain.  You have pain that is getting worse in your shoulders.  You faint or feel dizzy when you stand.  You have very bad pain in your belly (abdomen).  You are sick to your stomach (nauseous) for more than one day.  You have throwing up (vomiting) that lasts for more than one day.  You have leg pain. This information is not intended to replace advice given to you by your health care provider. Make sure you discuss any questions you have with your health care provider. Document Released: 09/03/2008 Document Revised: 06/15/2016 Document Reviewed: 05/13/2016 Elsevier Interactive Patient Education  2019 [employer]  AMBULATORY SURGERY  DISCHARGE INSTRUCTIONS   1) The drugs that you were given will stay in your system until tomorrow so for the next 24 hours you should not:  A) Drive an automobile B) Make any legal decisions C) Drink any alcoholic beverage   2) You may resume regular meals tomorrow.  Today it is better to start with liquids and gradually work up to solid foods.  You may eat anything you prefer, but it is better to start with liquids, then soup and crackers, and gradually work up to solid foods.   3) Please notify your doctor immediately if you have any unusual bleeding, trouble breathing, redness and pain at the surgery site, drainage, fever, or pain not relieved by medication.    4) Additional Instructions:        Please contact your physician with any problems or Same Day Surgery at [phone], Monday through Friday 6 am to 4 pm, or  at [facility] number at [phone].

## 2019-01-15 NOTE — Op Note (Signed)
SURGICAL OPERATIVE REPORT   DATE OF PROCEDURE: 01/15/2019  ATTENDING Surgeon(s): Ancil Linsey, MD  ASSISTANT(S): Hervey Ard, PA-S   ANESTHESIA: GETA  PRE-OPERATIVE DIAGNOSIS: Symptomatic Cholelithiasis (K80.20)  POST-OPERATIVE DIAGNOSIS: Symptomatic Cholelithiasis (K80.20)  PROCEDURE(S): (cpt's: 47562) 1.) Laparoscopic Cholecystectomy  INTRAOPERATIVE FINDINGS: Minimal pericholecystic inflammation with cystic duct and cystic artery clips well-secured, hemostasis at completion of procedure  INTRAOPERATIVE FLUIDS: 800 mL crystalloid   ESTIMATED BLOOD LOSS: Minimal (<20 mL)   URINE OUTPUT: No foley  SPECIMENS: Gallbladder  IMPLANTS: None  DRAINS: None   COMPLICATIONS: None apparent   CONDITION AT COMPLETION: Hemodynamically stable and extubated  DISPOSITION: PACU   INDICATION(S) FOR PROCEDURE:  Patient is a 33 y.o. female who recently presented with post-prandial RUQ > epigastric abdominal pain after eating fatty foods in particular. Ultrasound suggested cholelithiasis without sonographic evidence of cholecystitis. All risks, benefits, and alternatives to above elective procedures were discussed with the patient, who elected to proceed, and informed consent was accordingly obtained at that time.   DETAILS OF PROCEDURE:  Patient was brought to the operating suite and appropriately identified. General anesthesia was administered along with peri-operative prophylactic IV antibiotics, and endotracheal intubation was performed by anesthesiologist, along with NG/OG tube for gastric decompression. In supine position, operative site was prepped and draped in usual sterile fashion, and following a brief time out, initial 5 mm incision was made in a natural skin crease just above the umbilicus. Fascia was then elevated, and a Verress needle was inserted and its proper position confirmed using aspiration and saline meniscus test.  Upon insufflation of the abdominal cavity with  carbon dioxide to a well-tolerated pressure of 12-15 mmHg, 5 mm peri-umbilical port followed by laparoscope were inserted and used to inspect the abdominal cavity and its contents with no injuries from insertion of the first trochar noted. Three additional trocars were inserted, one at the epigastric position (10 mm) and two along the Right costal margin (5 mm). The table was then placed in reverse Trendelenburg position with the Right side up. Filmy adhesions between the gallbladder and omentum/duodenum/transverse colon were lysed using combined blunt dissection and selective electrocautery. The apex/dome of the gallbladder was grasped with an atraumatic grasper passed through the lateral port and retracted apically over the liver. The infundibulum was also grasped and retracted, exposing Calot's triangle. The peritoneum overlying the gallbladder infundibulum was incised and dissected free of surrounding peritoneal attachments, revealing the cystic duct and cystic artery, which were clipped twice on the patient side and once on the gallbladder specimen side close to the gallbladder. The gallbladder was then dissected from its peritoneal attachments to the liver using electrocautery, and the gallbladder was placed into a laparoscopic specimen bag and removed from the abdominal cavity via the epigastric port site. Hemostasis and secure placement of clips were confirmed, and intra-peritoneal cavity was inspected with no additional findings. PMI laparoscopic fascial closure device was then used to re-approximate fascia at the 10 mm epigastric port site.  All ports were then removed under direct visualization, and abdominal cavity was desuflated. All port sites were irrigated/cleaned, additional local anesthetic was injected at each incision, 3-0 Vicryl was used to re-approximate dermis at 10 mm port site(s), and subcuticular 4-0 Monocryl suture was used to re-approximate skin. Skin was then cleaned, dried, and  sterile skin glue was applied. Patient was then safely able to be awakened, extubated, and transferred to PACU for post-operative monitoring and care.   I was present for all aspects of the above procedure,  and no operative complications were apparent.

## 2019-01-15 NOTE — Anesthesia Procedure Notes (Signed)
Procedure Name: Intubation Date/Time: 01/15/2019 8:36 AM Performed by: Lavone Orn, CRNA Pre-anesthesia Checklist: Patient identified, Emergency Drugs available, Suction available, Patient being monitored and Timeout performed Patient Re-evaluated:Patient Re-evaluated prior to induction Oxygen Delivery Method: Circle system utilized Preoxygenation: Pre-oxygenation with 100% oxygen Induction Type: IV induction Ventilation: Mask ventilation without difficulty Laryngoscope Size: Mac and 4 Grade View: Grade II Tube type: Oral Tube size: 7.0 mm Number of attempts: 1 Airway Equipment and Method: Stylet Placement Confirmation: ETT inserted through vocal cords under direct vision,  positive ETCO2 and breath sounds checked- equal and bilateral Secured at: 22 cm Tube secured with: Tape Dental Injury: Teeth and Oropharynx as per pre-operative assessment

## 2019-01-18 ENCOUNTER — Telehealth: Payer: Self-pay | Admitting: *Deleted

## 2019-01-18 LAB — SURGICAL PATHOLOGY

## 2019-01-18 NOTE — Telephone Encounter (Signed)
Patient called and stated that she was laying down and felt a pop on her right side, and now it pulls every time she takes a deep breath. She had surgery on 01/15/19 by Dr.Davis

## 2019-01-18 NOTE — Telephone Encounter (Signed)
Talked to patient and she is not having pain , but she woke up from a nao and heard felt something pull. I advised her it might be sutures. Offer her to come in sooner. She states she will wait for her appointment on 01/28/2019. She will call back if she needs to be seen sooner.

## 2019-01-19 ENCOUNTER — Encounter: Payer: Self-pay | Admitting: Surgery

## 2019-01-19 ENCOUNTER — Other Ambulatory Visit: Payer: Self-pay

## 2019-01-19 ENCOUNTER — Ambulatory Visit (INDEPENDENT_AMBULATORY_CARE_PROVIDER_SITE_OTHER): Payer: Medicaid Other | Admitting: Surgery

## 2019-01-19 VITALS — BP 112/79 | HR 99 | Temp 97.9°F | Resp 16 | Ht 65.0 in | Wt 141.0 lb

## 2019-01-19 DIAGNOSIS — K8020 Calculus of gallbladder without cholecystitis without obstruction: Secondary | ICD-10-CM

## 2019-01-19 DIAGNOSIS — Z4889 Encounter for other specified surgical aftercare: Secondary | ICD-10-CM

## 2019-01-19 NOTE — Patient Instructions (Addendum)
Please call our office if you have any questions or concerns.     GENERAL POST-OPERATIVE PATIENT INSTRUCTIONS   WOUND CARE INSTRUCTIONS:  Keep a dry clean dressing on the wound if there is drainage. The initial bandage may be removed after 24 hours.  Once the wound has quit draining you may leave it open to air.  If clothing rubs against the wound or causes irritation and the wound is not draining you may cover it with a dry dressing during the daytime.  Try to keep the wound dry and avoid ointments on the wound unless directed to do so.  If the wound becomes bright red and painful or starts to drain infected material that is not clear, please contact your physician immediately.  If the wound is mildly pink and has a thick firm ridge underneath it, this is normal, and is referred to as a healing ridge.  This will resolve over the next 4-6 weeks.  BATHING: You may shower if you have been informed of this by your surgeon. However, Please do not submerge in a tub, hot tub, or pool until incisions are completely sealed or have been told by your surgeon that you may do so.  DIET:  You may eat any foods that you can tolerate.  It is a good idea to eat a high fiber diet and take in plenty of fluids to prevent constipation.  If you do become constipated you may want to take a mild laxative or take ducolax tablets on a daily basis until your bowel habits are regular.  Constipation can be very uncomfortable, along with straining, after recent surgery.  ACTIVITY:  You are encouraged to cough and deep breath or use your incentive spirometer if you were given one, every 15-30 minutes when awake.  This will help prevent respiratory complications and low grade fevers post-operatively if you had a general anesthetic.  You may want to hug a pillow when coughing and sneezing to add additional support to the surgical area, if you had abdominal or chest surgery, which will decrease pain during these times.  You are  encouraged to walk and engage in light activity for the next two weeks.  You should not lift more than 20 pounds, until 02/26/2019 as it could put you at increased risk for complications.  Twenty pounds is roughly equivalent to a plastic bag of groceries. At that time- Listen to your body when lifting, if you have pain when lifting, stop and then try again in a few days. Soreness after doing exercises or activities of daily living is normal as you get back in to your normal routine.  MEDICATIONS:  Try to take narcotic medications and anti-inflammatory medications, such as tylenol, ibuprofen, naprosyn, etc., with food.  This will minimize stomach upset from the medication.  Should you develop nausea and vomiting from the pain medication, or develop a rash, please discontinue the medication and contact your physician.  You should not drive, make important decisions, or operate machinery when taking narcotic pain medication.  SUNBLOCK Use sun block to incision area over the next year if this area will be exposed to sun. This helps decrease scarring and will allow you avoid a permanent darkened area over your incision.  QUESTIONS:  Please feel free to call our office if you have any questions, and we will be glad to assist you. [phone]

## 2019-01-19 NOTE — Progress Notes (Signed)
Surgical Clinic Progress/Follow-up Note   HPI:  33 y.o. Female presents to clinic for early post-op follow-up 4 Days s/p laparoscopic cholecystectomy Earlene Plater, 01/15/2019) for symptomatic cholelithiasis. Patient called office today with concern that 3 people suggested her eyes appeared yellow. She otherwise says she has not taken any of the prescribed narcotic pain medication and has not had pain at any of her incisions or RUQ abdominal pain. She explains she experience some cramping RLQ pain after eating Cheetos with what she describes as a "popping" sensation in her RLQ, followed by a "ball rolling inside" her RLQ, since which she's been reluctant to eat heavier foods, but she reports +flatus and +BM's WNL (specifically no diarrhea), denies N/V, fever/chills, CP, or SOB.  Review of Systems:  Constitutional: denies fever/chills  Respiratory: denies shortness of breath, wheezing  Cardiovascular: denies chest pain, palpitations  Gastrointestinal: abdominal pain, N/V, and bowel function as per interval history Skin: Denies any other rashes or skin discolorations except post-surgical wounds as per interval history  Vital Signs:  BP 112/79   Pulse 99   Temp 97.9 F (36.6 C) (Temporal)   Resp 16   Ht 5\' 5"  (1.651 m)   Wt 141 lb (64 kg)   LMP 12/29/2018 (Exact Date)   SpO2 99%   BMI 23.46 kg/m    Physical Exam:  Constitutional:  -- Normal body habitus  -- Awake, alert, and oriented x3 HEENT: -- PERRL, eyes completely anicteric Pulmonary:  -- No crackles -- Equal breath sounds bilaterally -- Breathing non-labored at rest Cardiovascular:  -- S1, S2 present  -- No pericardial rubs  Gastrointestinal:  -- Soft and non-distended, non-tender to palpation, no guarding/rebound tenderness -- Post-surgical incisions all well-approximated without any peri-incisional erythema or drainage -- No abdominal masses appreciated, pulsatile or otherwise  Musculoskeletal / Integumentary:  -- Wounds or  skin discoloration: None appreciated except post-surgical incisions as described above (GI) -- Extremities: B/L UE and LE FROM, hands and feet warm, no edema   Imaging: No new pertinent imaging available for review  Assessment:  33 y.o. yo Female with a problem list including...  Patient Active Problem List   Diagnosis Date Noted  . Calculus of gallbladder without cholecystitis without obstruction 12/31/2018    presents to clinic for early post-op follow-up evaluation, doing quite well without any evidence of jaundice/icterus or any abdominal pain just 4 Days s/p laparoscopic cholecystectomy Earlene Plater, 01/15/2019) for symptomatic cholelithiasis.  Plan:              - advance diet as tolerated              - okay to shower, but do not submerge incisions under water until 2 weeks after surgery             - no heavy lifting >15 - 20 lbs until 2 weeks after surgery, after which may gradually resume all activities without restrictions over the following 2 weeks             - apply sunblock particularly to incisions with sun exposure to reduce pigmentation of scars  - scheduled post-surgical follow-up offered, patient expresses preference to follow up as needed             - return to clinic as needed, instructed to call office if any questions or concerns  All of the above recommendations were discussed with the patient, and all of patient's questions were answered to her expressed satisfaction.  -- Scherrie Gerlach Earlene Plater, MD, RPVI  [place]: [facility] for exceptional care. Office: [phone]

## 2019-01-28 ENCOUNTER — Encounter: Payer: Medicaid Other | Admitting: Surgery

## 2019-02-18 ENCOUNTER — Other Ambulatory Visit: Payer: Self-pay

## 2019-02-18 ENCOUNTER — Emergency Department
Admission: EM | Admit: 2019-02-18 | Discharge: 2019-02-19 | Discharge status: Against Medical Advice | Payer: Medicaid Other | Attending: Emergency Medicine | Admitting: Emergency Medicine

## 2019-02-18 ENCOUNTER — Emergency Department: Payer: Medicaid Other

## 2019-02-18 DIAGNOSIS — R0602 Shortness of breath: Secondary | ICD-10-CM | POA: Diagnosis not present

## 2019-02-18 DIAGNOSIS — Z5321 Procedure and treatment not carried out due to patient leaving prior to being seen by health care provider: Secondary | ICD-10-CM | POA: Insufficient documentation

## 2019-02-18 LAB — CBC
HCT: 44.8 % (ref 36.0–46.0)
HEMOGLOBIN: 15 g/dL (ref 12.0–15.0)
MCH: 30.1 pg (ref 26.0–34.0)
MCHC: 33.5 g/dL (ref 30.0–36.0)
MCV: 90 fL (ref 80.0–100.0)
Platelets: 226 10*3/uL (ref 150–400)
RBC: 4.98 MIL/uL (ref 3.87–5.11)
RDW: 12.2 % (ref 11.5–15.5)
WBC: 8 10*3/uL (ref 4.0–10.5)
nRBC: 0 % (ref 0.0–0.2)

## 2019-02-18 LAB — BASIC METABOLIC PANEL
Anion gap: 10 (ref 5–15)
BUN: 15 mg/dL (ref 6–20)
CO2: 23 mmol/L (ref 22–32)
Calcium: 9.1 mg/dL (ref 8.9–10.3)
Chloride: 106 mmol/L (ref 98–111)
Creatinine, Ser: 0.81 mg/dL (ref 0.44–1.00)
GFR calc Af Amer: >60 mL/min (ref >60)
Glucose, Bld: 95 mg/dL (ref 70–99)
Potassium: 3.8 mmol/L (ref 3.5–5.1)
Sodium: 139 mmol/L (ref 135–145)

## 2019-02-18 LAB — TROPONIN I

## 2019-02-18 NOTE — ED Triage Notes (Signed)
Patient c/o SOB, cough, chest pain. Patient reports hx of COPD. Patient took albuterol inhaler with no relief.

## 2019-02-19 NOTE — ED Notes (Signed)
Pt ambulatory out to parking lot, no signs of returning.

## 2019-02-19 NOTE — ED Notes (Addendum)
Pt ambulatory to desk, inquiring about wait time.  This RN let pt know how many people were ahead of her, advised that I am unable to provide an accurate time frame due to the nature of the ED.  NAD noted at this time.

## 2019-04-01 ENCOUNTER — Other Ambulatory Visit: Payer: Self-pay

## 2019-04-01 ENCOUNTER — Encounter: Payer: Self-pay | Admitting: *Deleted

## 2019-04-01 ENCOUNTER — Emergency Department
Admission: EM | Admit: 2019-04-01 | Discharge: 2019-04-01 | Disposition: A | Discharge status: Home / Self Care | Payer: Medicaid Other | Attending: Student in an Organized Health Care Education/Training Program | Admitting: Student in an Organized Health Care Education/Training Program

## 2019-04-01 ENCOUNTER — Emergency Department: Payer: Medicaid Other

## 2019-04-01 DIAGNOSIS — G44209 Tension-type headache, unspecified, not intractable: Secondary | ICD-10-CM | POA: Insufficient documentation

## 2019-04-01 DIAGNOSIS — F17210 Nicotine dependence, cigarettes, uncomplicated: Secondary | ICD-10-CM | POA: Insufficient documentation

## 2019-04-01 DIAGNOSIS — Z79899 Other long term (current) drug therapy: Secondary | ICD-10-CM | POA: Diagnosis not present

## 2019-04-01 DIAGNOSIS — H9209 Otalgia, unspecified ear: Secondary | ICD-10-CM | POA: Diagnosis present

## 2019-04-01 MED ORDER — CYCLOBENZAPRINE HCL 5 MG PO TABS: 5.0000 mg | ORAL_TABLET | Freq: Three times a day (TID) | ORAL | 0 refills | Status: DC | PRN

## 2019-04-01 NOTE — Discharge Instructions (Addendum)
You exam and CT scan are normal at this time. You may continue to treat your headaches with your hydroxyzine, Tylenol, and the prescription muscle relaxant. Follow-up with your provider or return as needed.

## 2019-04-01 NOTE — ED Triage Notes (Signed)
Pt sent from next care for eval of swelling behind left ear and earache.  Sx for 4 days.  Pt alert speech clear.

## 2019-04-02 NOTE — ED Provider Notes (Signed)
[facility] Emergency Department Provider Note ____________________________________________  Time seen: 2035  I have reviewed the triage vital signs and the nursing notes.  HISTORY  Chief Complaint  Otalgia  HPI Katelyn Jefferson is a 33 y.o. female presents as up to the ED for evaluation of left-sided headache that has been described as pressure and tightness for the last 3 to 4 days.  Patient was sent to the ED by a local urgent care after she presented there for evaluation of left-sided headache pain.  She was also concern for some "swelling" that she was concerned about to the left mastoid region.  She is also noted some tenderness to the TMJs bilaterally.  Patient denies any recent trauma, injury, or accident.  She also denies any syncope, vomiting, visual disturbance, weakness, distal paresthesias.  She reports that she rarely has headaches, and this headache is been severe enough to the point where she thought she might pass out at onset.  She is also had some nausea and some dizziness related to it.  Patient denies any recent illness except for a right AOM she was treated for 2 weeks ago with a 10-day course of Augmentin.  She denies any tinnitus, vertigo, or vision change.  She has taken Tylenol for her headache but denies any significant change.  Patient medical history is consistent with anxiety, transient tachycardia.  Past Medical History:  Diagnosis Date  . Abdominal mass   . Anxiety   . Calculus of gallbladder without cholecystitis without obstruction 12/31/2018  . Decreased fetal movement 10/27/2015  . Dysrhythmia    tachycardia  . Heart murmur    Congential heart murmur  No longer present  . History of poor fetal growth 08/31/2015  . Miscarriage   . 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 Calculus of gallbladder without cholecystitis without obstruction 12/31/2018    Past Surgical History:   Procedure Laterality Date  . CESAREAN SECTION N/A 02/28/2016   Procedure: CESAREAN SECTION;  Surgeon: Suzy Bouchard, MD;  Location: [facility] ORS;  Service: Obstetrics;  Laterality: N/A;  . CESAREAN SECTION  2010  . CHOLECYSTECTOMY N/A 01/15/2019   Procedure: LAPAROSCOPIC CHOLECYSTECTOMY;  Surgeon: Ancil Linsey, MD;  Location: [facility] ORS;  Service: General;  Laterality: N/A;  . COLONOSCOPY  33 years old  . 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 TUBAL LIGATION Bilateral 02/28/2016   Procedure: BILATERAL TUBAL LIGATION;  Surgeon: Suzy Bouchard, MD;  Location: [facility] ORS;  Service: Obstetrics;  Laterality: Bilateral;  . WISDOM TOOTH EXTRACTION      Prior to Admission medications   Medication Sig Start Date End Date Taking? Authorizing Provider  cyclobenzaprine (FLEXERIL) 5 MG tablet Take 1 tablet (5 mg total) by mouth 3 (three) times daily as needed for muscle spasms. 04/01/19   Yerlin Gasparyan, Charlesetta Ivory, PA-C  sertraline (ZOLOFT) 100 MG tablet Take 100 mg by mouth daily.    [provider]    Allergies Cephalexin; Sulfa antibiotics; and Sulfur  Family History  Problem Relation Age of Onset  . Stroke Mother   . Cancer Paternal Grandfather     Social History Social History   Tobacco Use  . Smoking status: Current Every Day Smoker    Packs/day: 0.50    Years: 10.00  Pack years: 5.00    Types: Cigarettes  . Smokeless tobacco: Never Used  Substance Use Topics  . Alcohol use: No  . Drug use: No    Review of Systems  Constitutional: Negative for fever. Eyes: Negative for visual changes. ENT: Negative for sore throat. Cardiovascular: Negative for chest pain. Respiratory: Negative for shortness of breath. Gastrointestinal: Negative for abdominal pain, vomiting and diarrhea. Genitourinary: Negative for  dysuria. Musculoskeletal: Negative for back pain. Skin: Negative for rash. Neurological: Negative for focal weakness or numbness. Reports left-sided headache. ____________________________________________  PHYSICAL EXAM:  VITAL SIGNS: ED Triage Vitals [04/01/19 1922]  Enc Vitals Group     BP (!) 132/95     Pulse Rate 80     Resp 18     Temp 98 F (36.7 C)     Temp Source Oral     SpO2 99 %     Weight 145 lb (65.8 kg)     Height 5\' 4"  (1.626 m)     Head Circumference      Peak Flow      Pain Score 6     Pain Loc      Pain Edu?      Excl. in GC?     Constitutional: Alert and oriented. Well appearing and in no distress. Head: Normocephalic and atraumatic. No battle's sign.  Eyes: Conjunctivae are normal. PERRL. Normal extraocular movements and fundi bilaterally. Ears: Canals clear. TMs intact bilaterally. Nose: No congestion/rhinorrhea/epistaxis. Mouth/Throat: Mucous membranes are moist. Uvula is midline.  Neck: Supple. Normal ROM without crepitus.  Hematological/Lymphatic/Immunological: No cervical, preauricular, or m  lymphadenopathy. Cardiovascular: Normal rate, regular rhythm. Normal distal pulses. Respiratory: Normal respiratory effort. No wheezes/rales/rhonchi. Gastrointestinal: Soft and nontender. No distention. Musculoskeletal: Nontender with normal range of motion in all extremities.  Neurologic:  CN II-XII grossly intact. Normal gait without ataxia. Normal speech and language. No gross focal neurologic deficits are appreciated. Skin:  Skin is warm, dry and intact. No rash noted. Psychiatric: Mood and affect are normal. Patient exhibits appropriate insight and judgment. ____________________________________________   RADIOLOGY  CT Head w/o CM IMPRESSION: Normal study. ____________________________________________  PROCEDURES  Procedures ____________________________________________  INITIAL IMPRESSION / ASSESSMENT AND PLAN / ED COURSE  Differential  diagnosis includes, but is not limited to, intracranial hemorrhage, meningitis/encephalitis, previous head trauma, cavernous venous thrombosis, tension headache, temporal arteritis, migraine or migraine equivalent, idiopathic intracranial hypertension, and non-specific headache.  Patient presents to the ED for evaluation of left-sided headache as well as some concern over some enlarged lymph nodes versus mastoiditis.  Her exam is overall benign at this time her vital signs are stable and exam is reassuring.  CT shows no acute intracranial process.  Patient presentation likely represents a an atypical headache versus a migraine syndrome.  She will be discharged with prescriptions to take as directed.  She is encouraged to follow-up with her primary provider or return to the ED as needed. ____________________________________________  FINAL CLINICAL IMPRESSION(S) / ED DIAGNOSES  Final diagnoses:  Acute non intractable tension-type headache      Reid Nawrot, Charlesetta IvoryJenise V Bacon, PA-C 04/02/19 2149    Willy Eddyobinson, Patrick, MD 04/05/19 [phone]

## 2019-11-12 ENCOUNTER — Emergency Department
Admission: EM | Admit: 2019-11-12 | Discharge: 2019-11-12 | Disposition: A | Discharge status: Home / Self Care | Payer: Medicaid Other | Attending: Emergency Medicine | Admitting: Emergency Medicine

## 2019-11-12 ENCOUNTER — Other Ambulatory Visit: Payer: Self-pay

## 2019-11-12 ENCOUNTER — Encounter: Payer: Self-pay | Admitting: Emergency Medicine

## 2019-11-12 DIAGNOSIS — Y998 Other external cause status: Secondary | ICD-10-CM | POA: Insufficient documentation

## 2019-11-12 DIAGNOSIS — F17210 Nicotine dependence, cigarettes, uncomplicated: Secondary | ICD-10-CM | POA: Insufficient documentation

## 2019-11-12 DIAGNOSIS — S3992XA Unspecified injury of lower back, initial encounter: Secondary | ICD-10-CM | POA: Diagnosis present

## 2019-11-12 DIAGNOSIS — Y9344 Activity, trampolining: Secondary | ICD-10-CM | POA: Diagnosis not present

## 2019-11-12 DIAGNOSIS — W19XXXA Unspecified fall, initial encounter: Secondary | ICD-10-CM | POA: Diagnosis not present

## 2019-11-12 DIAGNOSIS — Z79899 Other long term (current) drug therapy: Secondary | ICD-10-CM | POA: Insufficient documentation

## 2019-11-12 DIAGNOSIS — S39012A Strain of muscle, fascia and tendon of lower back, initial encounter: Secondary | ICD-10-CM | POA: Diagnosis not present

## 2019-11-12 DIAGNOSIS — Y92838 Other recreation area as the place of occurrence of the external cause: Secondary | ICD-10-CM | POA: Diagnosis not present

## 2019-11-12 DIAGNOSIS — G8929 Other chronic pain: Secondary | ICD-10-CM

## 2019-11-12 LAB — URINALYSIS, COMPLETE (UACMP) WITH MICROSCOPIC
Bacteria, UA: NONE SEEN
Bilirubin Urine: NEGATIVE
Glucose, UA: NEGATIVE mg/dL
Hgb urine dipstick: NEGATIVE
Ketones, ur: NEGATIVE mg/dL
Leukocytes,Ua: NEGATIVE
Nitrite: NEGATIVE
Protein, ur: NEGATIVE mg/dL
Specific Gravity, Urine: 1.029 (ref 1.005–1.030)
pH: 6 (ref 5.0–8.0)

## 2019-11-12 LAB — POCT PREGNANCY, URINE: Preg Test, Ur: NEGATIVE

## 2019-11-12 MED ORDER — TRAMADOL HCL 50 MG PO TABS: 50.0000 mg | ORAL_TABLET | Freq: Four times a day (QID) | ORAL | 0 refills | Status: DC | PRN

## 2019-11-12 MED ORDER — NAPROXEN 500 MG PO TABS: 500.0000 mg | ORAL_TABLET | Freq: Two times a day (BID) | ORAL | 0 refills | Status: DC

## 2019-11-12 MED ORDER — OXYCODONE-ACETAMINOPHEN 5-325 MG PO TABS
1.0000 | ORAL_TABLET | Freq: Once | ORAL | Status: DC
  Filled 2019-11-12: qty 1

## 2019-11-12 NOTE — ED Notes (Signed)
This RN to room to ask about legal guardian status and there is no contact for legal guardian and pt was not in room. Will reassess. MD made aware.

## 2019-11-12 NOTE — ED Provider Notes (Signed)
[facility] Emergency Department Provider Note  ____________________________________________  Time seen: Approximately 12:51 PM  I have reviewed the triage vital signs and the nursing notes.   HISTORY  Chief Complaint Back Pain   HPI Katelyn Jefferson is a 33 y.o. female Who complains of bilateral lower back pain radiating into the bilateral thighs, worsening for the past 3 days.  This started after going to the trampoline park during which time she fell onto her right side.  She did not notice any immediate increase in her pain, but over the last couple of days she has noticed the pain has become increasingly severe, worse when she gets up and walks.  Better when she lays down.  Worse with turning.  No bowel or bladder incontinence or retention.  No lower extremity paresthesia or motor weakness.  No change in balance.  Review of electronic medical record shows she previously was followed up with the [facility] last year where they referred her to physical therapy and chiropractor.  She says she went but did not notice any immediate improvement so she stopped going.  She has not followed up with spine center.  She has a history of chronic back pain since she was 33 years old.  Patient has only been taking Tylenol at home for pain.  Started tizanidine yesterday and has not noticed any improvement yet.  Called her primary care office this morning, they recommended home care and follow-up.: Patient calling r/t back pain for the past 2 weeks. States she doesn't recall any injuries. Patient was seen at [facility] yesterday and prescribed muscle relaxer but her pharmacy is not open yet. Requesting some home care. Advised to start medication as soon as possible, try heat pad for about 10 minutes or sit down in warm bath instead of shower.          Past Medical History:  Diagnosis Date  . Abdominal mass   . Anxiety   . Calculus of gallbladder without cholecystitis without  obstruction 12/31/2018  . Decreased fetal movement 10/27/2015  . Dysrhythmia    tachycardia  . Heart murmur    Congential heart murmur  No longer present  . History of poor fetal growth 08/31/2015  . Miscarriage   . Single umbilical artery 19/04/931     Patient Active Problem List   Diagnosis Date Noted  . Calculus of gallbladder without cholecystitis without obstruction 12/31/2018     Past Surgical History:  Procedure Laterality Date  . CESAREAN SECTION N/A 02/28/2016   Procedure: CESAREAN SECTION;  Surgeon: Boykin Nearing, MD;  Location: [facility] ORS;  Service: Obstetrics;  Laterality: N/A;  . CESAREAN SECTION  2010  . CHOLECYSTECTOMY N/A 01/15/2019   Procedure: LAPAROSCOPIC CHOLECYSTECTOMY;  Surgeon: Vickie Epley, MD;  Location: [facility] ORS;  Service: General;  Laterality: N/A;  . COLONOSCOPY  33 years old  . 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 TUBAL LIGATION Bilateral 02/28/2016   Procedure: BILATERAL TUBAL LIGATION;  Surgeon: Boykin Nearing, MD;  Location: [facility] ORS;  Service: Obstetrics;  Laterality: Bilateral;  . WISDOM TOOTH EXTRACTION       Prior to Admission medications   Medication Sig Start Date End Date Taking? Authorizing Provider  naproxen (NAPROSYN) 500 MG tablet Take 1 tablet (500 mg total) by mouth 2 (two) times daily with a  meal. 11/12/19   Sharman Cheek, MD  sertraline (ZOLOFT) 100 MG tablet Take 100 mg by mouth daily.    [provider]  traMADol (ULTRAM) 50 MG tablet Take 1 tablet (50 mg total) by mouth every 6 (six) hours as needed. 11/12/19   Sharman Cheek, MD     Allergies Cephalexin, Sulfa antibiotics, and Sulfur   Family History  Problem Relation Age of Onset  . Stroke Mother   . Cancer Paternal Grandfather     Social History Social History   Tobacco Use  .  Smoking status: Current Every Day Smoker    Packs/day: 0.50    Years: 10.00    Pack years: 5.00    Types: Cigarettes  . Smokeless tobacco: Never Used  Substance Use Topics  . Alcohol use: No  . Drug use: No    Review of Systems  Constitutional:   No fever or chills.  ENT:   No sore throat. No rhinorrhea. Cardiovascular:   No chest pain or syncope. Respiratory:   No dyspnea or cough. Gastrointestinal:   Negative for abdominal pain, vomiting and diarrhea.  Musculoskeletal:   Acute on chronic back pain as above All other systems reviewed and are negative except as documented above in ROS and HPI.  ____________________________________________   PHYSICAL EXAM:  VITAL SIGNS: ED Triage Vitals  Enc Vitals Group     BP 11/12/19 1204 120/78     Pulse Rate 11/12/19 1204 92     Resp 11/12/19 1204 16     Temp 11/12/19 1204 98.1 F (36.7 C)     Temp Source 11/12/19 1204 Oral     SpO2 11/12/19 1204 100 %     Weight 11/12/19 1203 160 lb (72.6 kg)     Height 11/12/19 1203 5\' 4"  (1.626 m)     Head Circumference --      Peak Flow --      Pain Score 11/12/19 1215 (P) 8     Pain Loc --      Pain Edu? --      Excl. in GC? --     Vital signs reviewed, nursing assessments reviewed.   Constitutional:   Alert and oriented. Non-toxic appearance. Eyes:   Conjunctivae are normal. EOMI. ENT      Head:   Normocephalic and atraumatic.  Cardiovascular:   RRR. Cap refill less than 2 seconds. Respiratory:   Unlabored breathing Gastrointestinal:   Soft and nontender. Non distended. There is no CVA tenderness.  No rebound, rigidity, or guarding. Musculoskeletal:   Normal range of motion in all extremities.  No edema.  No midline spinal tenderness.  There is tenderness in the low back musculature bilaterally reproducing her symptoms.  Straight leg raise is positive at about 30 degrees bilaterally reproducing her symptoms. Neurologic:   Normal speech and language.  Motor grossly intact.  EHL  strong bilaterally 2+ patellar reflexes bilaterally. No acute focal neurologic deficits are appreciated.   ____________________________________________    LABS (pertinent positives/negatives) (all labs ordered are listed, but only abnormal results are displayed) Labs Reviewed  URINALYSIS, COMPLETE (UACMP) WITH MICROSCOPIC - Abnormal; Notable for the following components:      Result Value   Color, Urine YELLOW (*)    APPearance HAZY (*)    All other components within normal limits  POC URINE PREG, ED  POCT PREGNANCY, URINE   ____________________________________________   EKG  ____________________________________________    RADIOLOGY  No results found.  ____________________________________________   PROCEDURES Procedures  ____________________________________________  CLINICAL IMPRESSION / ASSESSMENT AND PLAN / ED COURSE  Pertinent labs & imaging results that were available during my care of the patient were reviewed by me and considered in my medical decision making (see chart for details).  Katelyn Jefferson was evaluated in Emergency Department on 11/12/2019 for the symptoms described in the history of present illness. She was evaluated in the context of the global COVID-19 pandemic, which necessitated consideration that the patient might be at risk for infection with the SARS-CoV-2 virus that causes COVID-19. Institutional protocols and algorithms that pertain to the evaluation of patients at risk for COVID-19 are in a state of rapid change based on information released by regulatory bodies including the CDC and federal and state organizations. These policies and algorithms were followed during the patient's care in the ED.   Patient presents with acute on chronic low back pain.  No acute injuries, neurologically intact, exam and history are reassuring.  Vital signs are unremarkable.  Recommended starting an NSAID.  She is hesitant because of her Zoloft and not experience  combining that with ibuprofen in the past.  I recommended a trial of Aleve to see if this is better tolerated.  Also recommended capsaicin arthritis cream and heating pad and following up with [facility].  No evidence of central cord syndrome/cauda equina or epidural abscess or other acute issue.  Stable for discharge home.  Offered oral Percocet in the ED which she refused.      ____________________________________________   FINAL CLINICAL IMPRESSION(S) / ED DIAGNOSES    Final diagnoses:  Chronic bilateral low back pain without sciatica  Strain of lumbar region, initial encounter     ED Discharge Orders         Ordered    traMADol (ULTRAM) 50 MG tablet  Every 6 hours PRN     11/12/19 1327    naproxen (NAPROSYN) 500 MG tablet  2 times daily with meals     11/12/19 1327          Portions of this note were generated with dragon dictation software. Dictation errors may occur despite best attempts at proofreading.   Sharman CheekStafford, Araya Roel, MD 11/12/19 [phone]

## 2019-11-12 NOTE — ED Triage Notes (Signed)
Pt in via POV, reports ongoing lower back pain x 2-3 days, causing difficulty with ambulation.  Denies any recent injury, denies urinary symptoms.  NAD noted at this time.

## 2019-11-12 NOTE — ED Notes (Signed)
See triage note  Lower back pain for couple of days   States pain is making it hard to ambulate

## 2019-11-20 IMAGING — CT CT HEAD WITHOUT CONTRAST
3 series · 15 of 45 positions shown, 18 images · non-contrast
Comparison: None.

CLINICAL DATA: Headache

EXAM:
CT HEAD WITHOUT CONTRAST
TECHNIQUE: Contiguous axial images were obtained from the base of the skull
through the vertex without intravenous contrast.

[Series 2: head wo · axial · 0.41mm/px · z∈[-125,-10]mm · 9 of 28 slices shown, 12 images]
[im 3/28  brain]
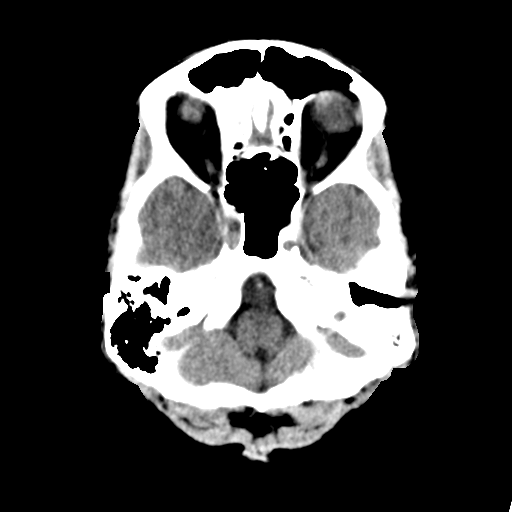
[im 3/28  bone]
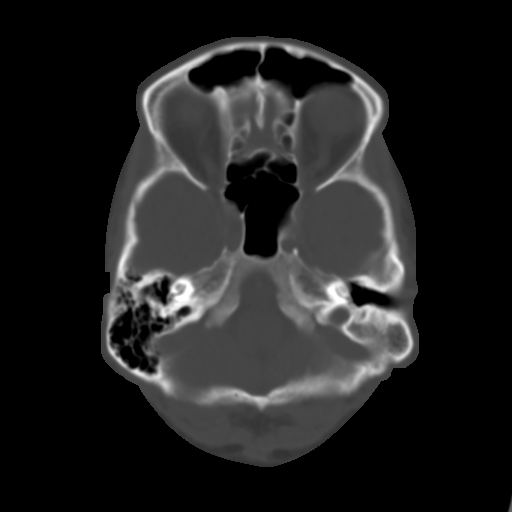
[im 6/28  brain]
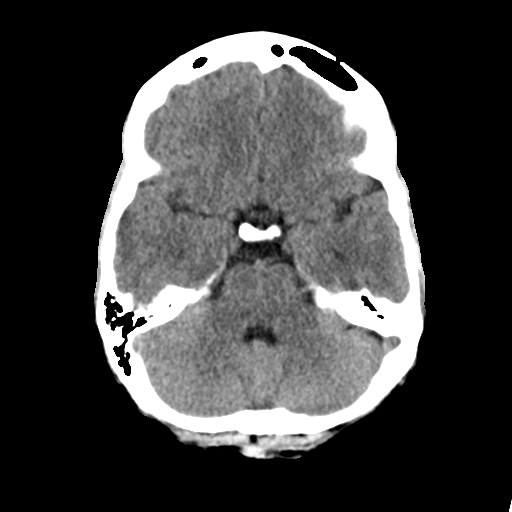
[im 9/28  brain]
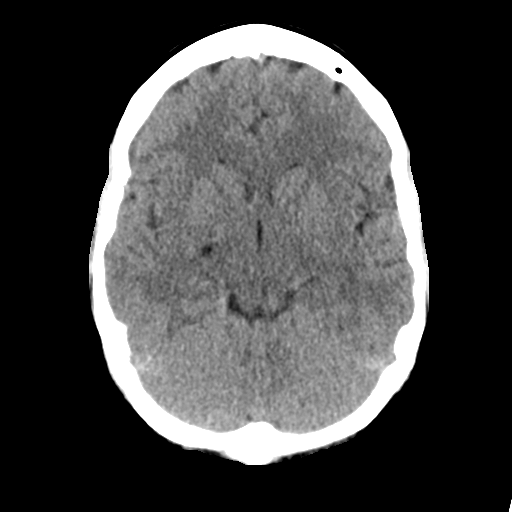
[im 12/28  brain]
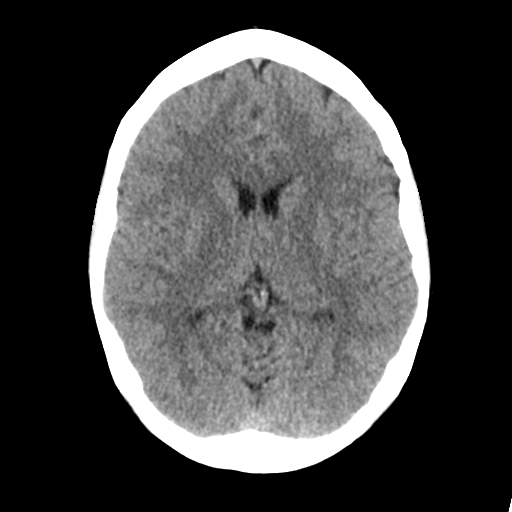
[im 15/28  brain]
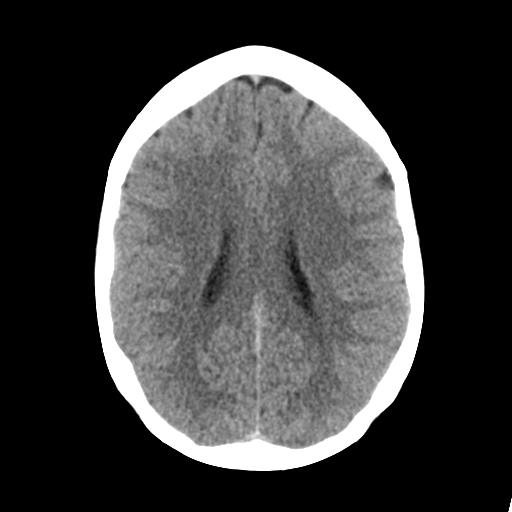
[im 15/28  bone]
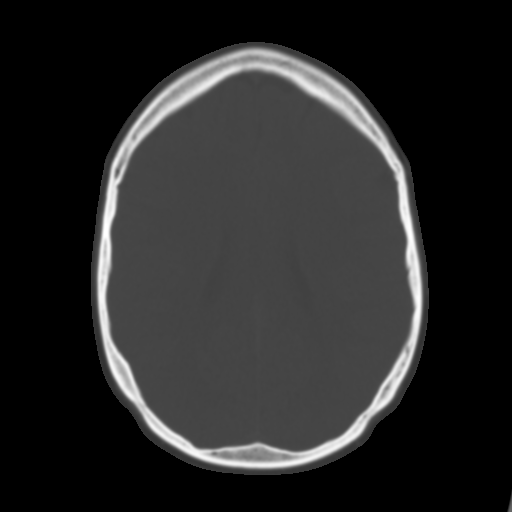
[im 17/28  brain]
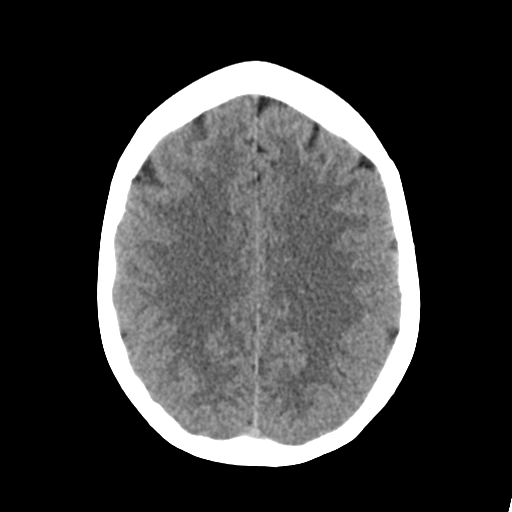
[im 20/28  brain]
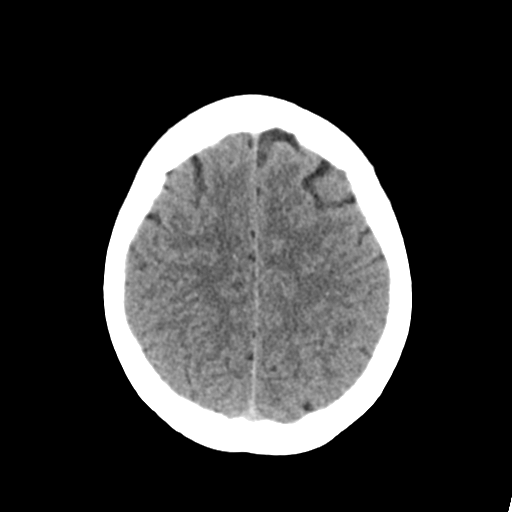
[im 23/28  brain]
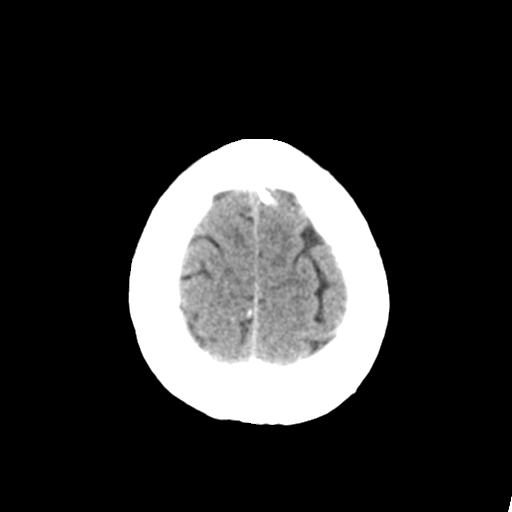
[im 26/28  brain]
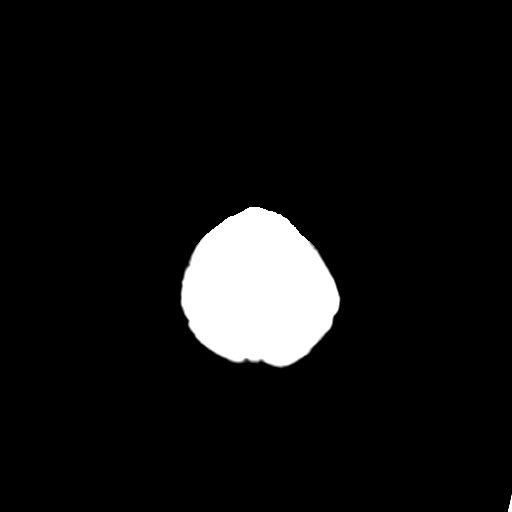
[im 26/28  bone]
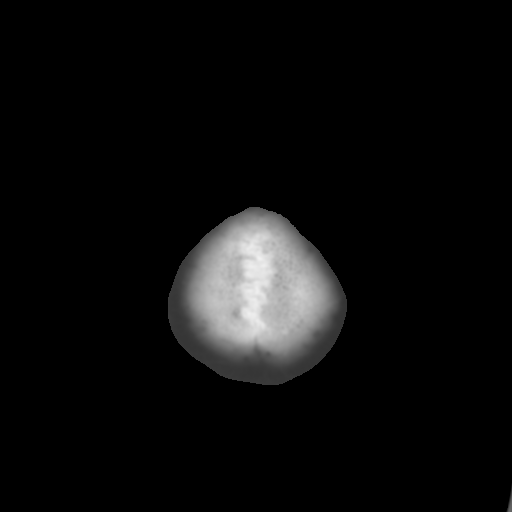

[Series 4: coronal soft tissue · coronal · 0.29mm/px · 3 of 62 slices shown]
[im 21/62  brain]
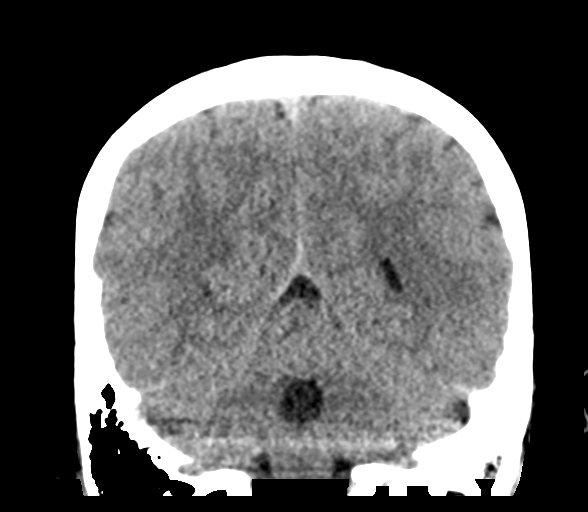
[im 28/62  brain]
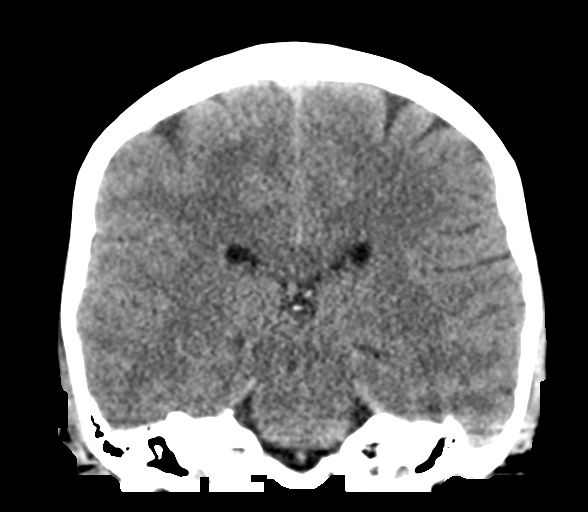
[im 34/62  brain]
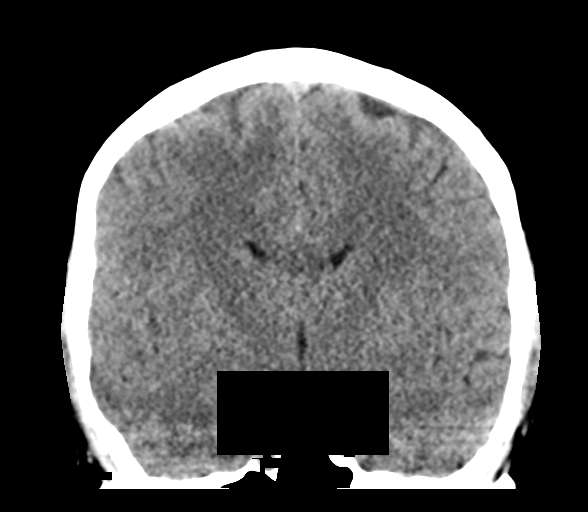

[Series 5: sagittal soft tissue · sagittal · 0.29mm/px · 3 of 48 slices shown]
[im 16/48  brain]
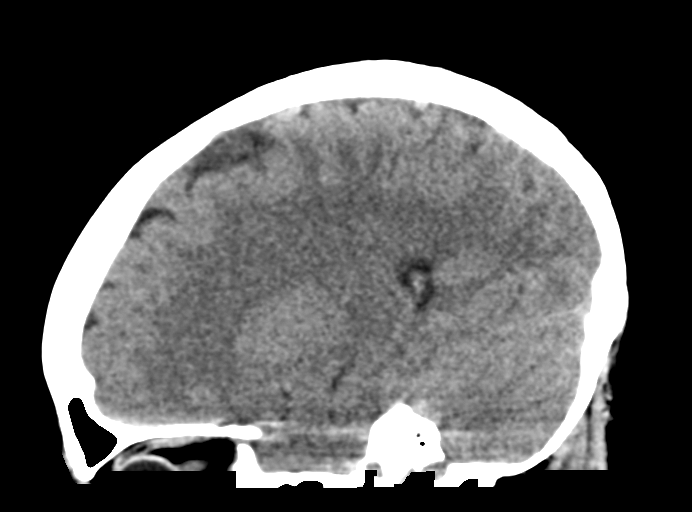
[im 24/48  brain]
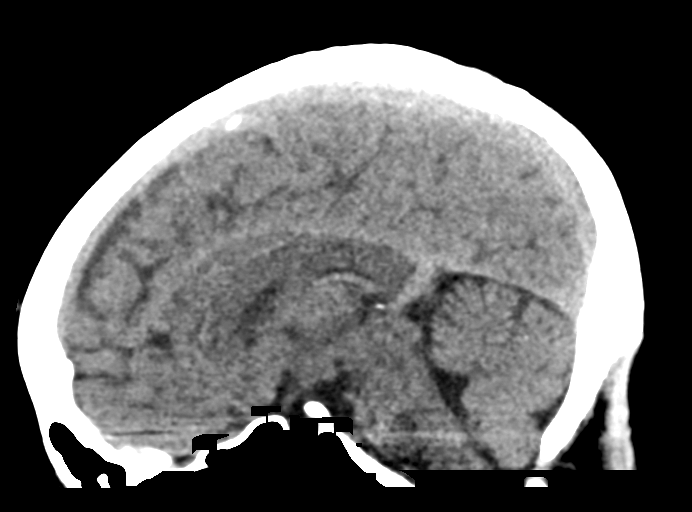
[im 32/48  brain]
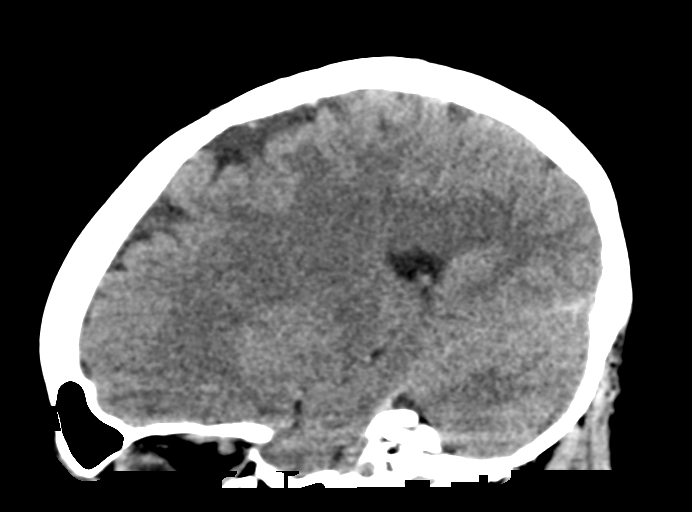

[15 of 45 positions shown; findings below may reference images not displayed]

FINDINGS: Brain: No acute intracranial abnormality. Specifically, no
hemorrhage, hydrocephalus, mass lesion, acute infarction, or
significant intracranial injury.

Vascular: No hyperdense vessel or unexpected calcification.

Skull: No acute calvarial abnormality.

Sinuses/Orbits: Visualized paranasal sinuses and mastoids clear.
Orbital soft tissues unremarkable.

Other: None
IMPRESSION: Normal study.

## 2019-12-15 ENCOUNTER — Other Ambulatory Visit: Payer: Self-pay

## 2019-12-15 ENCOUNTER — Ambulatory Visit
Admission: EM | Admit: 2019-12-15 | Discharge: 2019-12-15 | Disposition: A | Discharge status: Home / Self Care | Payer: Medicaid Other | Attending: Emergency Medicine | Admitting: Emergency Medicine

## 2019-12-15 ENCOUNTER — Ambulatory Visit: Payer: Medicaid Other | Attending: Obstetrics and Gynecology

## 2019-12-15 ENCOUNTER — Encounter: Payer: Self-pay | Admitting: Emergency Medicine

## 2019-12-15 DIAGNOSIS — N3001 Acute cystitis with hematuria: Secondary | ICD-10-CM

## 2019-12-15 DIAGNOSIS — B3731 Acute candidiasis of vulva and vagina: Secondary | ICD-10-CM

## 2019-12-15 DIAGNOSIS — R293 Abnormal posture: Secondary | ICD-10-CM | POA: Diagnosis present

## 2019-12-15 DIAGNOSIS — M533 Sacrococcygeal disorders, not elsewhere classified: Secondary | ICD-10-CM | POA: Diagnosis present

## 2019-12-15 DIAGNOSIS — M62838 Other muscle spasm: Secondary | ICD-10-CM | POA: Diagnosis not present

## 2019-12-15 DIAGNOSIS — B373 Candidiasis of vulva and vagina: Secondary | ICD-10-CM | POA: Diagnosis present

## 2019-12-15 HISTORY — DX: Depression, unspecified: F32.A

## 2019-12-15 LAB — URINALYSIS, COMPLETE (UACMP) WITH MICROSCOPIC
Bilirubin Urine: NEGATIVE
Glucose, UA: 100 mg/dL — AB
Nitrite: POSITIVE — AB
Protein, ur: NEGATIVE mg/dL
Specific Gravity, Urine: >1.03 — ABNORMAL HIGH (ref 1.005–1.030)
pH: 5.5 (ref 5.0–8.0)

## 2019-12-15 MED ORDER — PHENAZOPYRIDINE HCL 200 MG PO TABS: 200.0000 mg | ORAL_TABLET | Freq: Three times a day (TID) | ORAL | 0 refills | Status: DC | PRN

## 2019-12-15 MED ORDER — NITROFURANTOIN MONOHYD MACRO 100 MG PO CAPS: 100.0000 mg | ORAL_CAPSULE | Freq: Two times a day (BID) | ORAL | 0 refills | Status: DC

## 2019-12-15 MED ORDER — FLUCONAZOLE 150 MG PO TABS: 150.0000 mg | ORAL_TABLET | Freq: Once | ORAL | 1 refills | Status: AC

## 2019-12-15 NOTE — Discharge Instructions (Addendum)
Continue pushing fluids.  The Pyridium will help your symptoms and the Macrobid will take care of the urinary tract infection.  The Diflucan will cure the yeast infection.

## 2019-12-15 NOTE — Therapy (Signed)
[facility] MAIN [facility] SERVICES [address] [place], [place], [address] Phone: [phone]   Fax:  [phone]  Physical Therapy Evaluation  The patient has been informed of current processes in place at Outpatient Rehab to protect patients from Covid-19 exposure including social distancing, schedule modifications, and new cleaning procedures. After discussing their particular risk with a therapist based on the patient's personal risk factors, the patient has decided to proceed with in-person therapy.   Patient Details  Name: Katelyn Jefferson MRN: [id number] Date of Birth: 1986-06-07 No data recorded  Encounter Date: 12/15/2019  PT End of Session - 12/15/19 1022    Visit Number  1    Number of Visits  10    Date for PT Re-Evaluation  01/12/20    Authorization Type  mcaid    Authorization Time Period  12/15/19 through 01/12/20    Authorization - Visit Number  1    Authorization - Number of Visits  4    PT Start Time  0800    PT Stop Time  0923    PT Time Calculation (min)  83 min    Activity Tolerance  Patient tolerated treatment well    Behavior During Therapy  [facility] for tasks assessed/performed       Past Medical History:  Diagnosis Date  . Abdominal mass   . Anxiety   . Calculus of gallbladder without cholecystitis without obstruction 12/31/2018  . Decreased fetal movement 10/27/2015  . Dysrhythmia    tachycardia  . Heart murmur    Congential heart murmur  No longer present  . History of poor fetal growth 08/31/2015  . Miscarriage   . Single umbilical artery 10/12/2015    Past Surgical History:  Procedure Laterality Date  . CESAREAN SECTION N/A 02/28/2016   Procedure: CESAREAN SECTION;  Surgeon: Suzy Bouchard, MD;  Location: [facility] ORS;  Service: Obstetrics;  Laterality: N/A;  . CESAREAN SECTION  2010  . CHOLECYSTECTOMY N/A 01/15/2019   Procedure: LAPAROSCOPIC CHOLECYSTECTOMY;  Surgeon: Ancil Linsey, MD;  Location: [facility] ORS;   Service: General;  Laterality: N/A;  . COLONOSCOPY  34 years old  . 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 TUBAL LIGATION Bilateral 02/28/2016   Procedure: BILATERAL TUBAL LIGATION;  Surgeon: Suzy Bouchard, MD;  Location: [facility] ORS;  Service: Obstetrics;  Laterality: Bilateral;  . WISDOM TOOTH EXTRACTION      There were no vitals filed for this visit.    Pelvic Floor Physical Therapy Evaluation and Assessment  SCREENING  Falls in last 6 mo: yes, fell off a tire swing and landed on her tailbone which increased her pain and it has stayed higher. Around June 2020  Patient's communication preference:   Red Flags:  Have you had any night sweats? Yes, about 6 months ago Unexplained weight loss? none Saddle anesthesia? none Unexplained changes in bowel or bladder habits? none  SUBJECTIVE  Patient reports: Has pain in the low back and hips that feels like it is pulling/wants to go down and is relieved when her husband presses up on the vulvar area.   Precautions:  Scar tissue from multiple abdominal areas.  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  Urinary  History: Believes she has a UTI, has been 4 days that she has been trying to flush it out. Has drops of leakage with cough/sneeze occasionally and urge incontinence which is the worst with running.    Gastrointestinal History: Having very strong urge to have a BM and able to run to the bathroom. One of her biggest fears is vomiting, she had a stomach bug and it made her vomit, they put her on zoloft but she has not had normal BM's ever since. Stools are runny and happen every time she eats a meal. She  eats a meal once every couple days but only snacksin-between. "used to be anorexic". Had a chicken breast and some potatoes and "thought she was going to die" when she had to have a BM.  Sexual activity/pain: Yes, with deeper thrusting. Has always been this way. Participates in anal sex and fears this could cause FI.  Location of pain: Low back, B hips, and vaginal area. Current pain:  4/10  Max pain:  10/10 Least pain:  4/10 Nature of pain: throbbing, constant  Patient Goals: Stop the pain and be able to exercise without being in pain. Be able to go walk around the park.   OBJECTIVE  Posture/Observations:  Sitting:  Standing: LLE ER'd slightly. Reports standing with weight on one or the other foot most often, hyperlordosis, R PSIS high, R knee slightly bent. Supine: L ASIS high Prone: L PSIS low, L transverse processes more prominent. Coccyx flexed and deviated L  Palpation/Segmental Motion/Joint Play: TTP to B Piriformis, OI, L Glute min.  Pain radiates into LLE with pressure at L PSIS  Special tests:   Forward bend: fingertips ~ 16 in. From floor. Pt. Refuses to go further due to pain, unable to assess for scoliosis. Supine-to-long-sit: LLE short in both, less short in sitting Leg-length: RLE long by 1 cm.  Range of Motion/Flexibilty:  Spine: R SB ~ 2 fingers from knee, L SB 1 finger from knee with more of a stretch on the R Hips:   Strength/MMT: Deferred to follow-up LE MMT  LE MMT Left Right  Hip flex:  (L2) /5 /5  Hip ext: /5 /5  Hip abd: /5 /5  Hip add: /5 /5  Hip IR /5 /5  Hip ER /5 /5     Abdominal:  Palpation: TTP to LUQ, LLQ>R with decreased fascial mobility and TTP to B hip-flexors and L>R adductors. Diastasis: ~2.5 fingers, narrows to 1 finger with crunch,does not narrow eith leg-lift   Pelvic Floor External Exam: Deferred to follow-up Introitus Appears:  Skin integrity:  Palpation: Cough: Prolapse visible?: Scar mobility:  Internal Vaginal  Exam: Strength (PERF):  Symmetry: Palpation: Prolapse:   Internal Rectal Exam: Strength (PERF): Symmetry: Palpation: Prolapse:   Gait Analysis: Deferred to follow-up   Pelvic Floor Outcome Measures: PFDI: 153/300 FISI: 23/61 FOTO: 75% impaired, Female NIH-CPSI: 18/43 (42%)  INTERVENTIONS THIS SESSION: Self-care: Educated on the structure and function of the pelvic floor in relation to their symptoms as well as the POC, and initial HEP in order to set patient expectations and understanding from which we will build on in the future sessions. Educated on diaphragmatic breathing and fight or flight vs. Rest and digest and effect on her digestion, given first step only of urge suppression technique to help with urgency.   Total time: 83 min.                 Objective measurements completed on examination: See above  findings.                PT Short Term Goals - 12/15/19 1005      PT SHORT TERM GOAL #1   Title  Patient will demonstrate improved pelvic alignment and balance of musculature surrounding the pelvis to facilitate decreased PFM spasms and decrease pelvic pain.    Baseline  Pt demonstrates LLD with  RLE long by ~1 cm, L posterior rotation and up-slip as well as spasms surrounding and within the pelvis.    Time  5    Period  Weeks    Status  New    Target Date  01/19/20      PT SHORT TERM GOAL #2   Title  Patient will demonstrate HEP x1 in the clinic and will be IND with stress-management techniques to demonstrate understanding and proper form to allow for further improvement of Sx.    Baseline  Pt. lacks knowledge of therepeutic exercises to decrease her pain and Sx.    Time  3    Period  Weeks    Status  New    Target Date  01/05/20      PT SHORT TERM GOAL #3   Title  Patient will demonstrate a coordinated contraction, relaxation, and bulge of the pelvic floor muscles to demonstrate functional recruitment and motion and allow for further  strengthening.    Baseline  Pt. having UUI, SUI, and FI in setting of spasms indicating likely discoordination of PFM.    Time  5    Period  Weeks    Status  New    Target Date  01/19/20      PT SHORT TERM GOAL #4   Title  Pt. will eat meals daily and intake adequate fluid to maintain improved health and  nutrition without increased pain or FI.    Baseline  Pt. only eating a meal every 2-3 days with "light snacks" occasionally due to pain and FI.    Time  5    Period  Weeks    Status  New    Target Date  01/19/20      PT SHORT TERM GOAL #5   Title  Pt. will be compliant with wearing heel-lift during ~90% of the day while being active to help prevent return of spasms and malalignment.    Baseline  Pt. unaware on LLD with RLE long.    Time  3    Period  Weeks    Status  New    Target Date  01/05/20        PT Long Term Goals - 12/15/19 0954      PT LONG TERM GOAL #1   Title  Patient will describe pain no greater than 2/10 during and following walking or light extercise for 30 min. to demonstrate improved functional ability.    Baseline  Pt. does not exercise due to increased pain.    Time  10    Period  Weeks    Status  New    Target Date  02/23/20      PT LONG TERM GOAL #2   Title  Patient will score at or below 63/300 on the PFDI, 7/61 on the FISI, 25% on the FOTO, AND 21% on the Female NIH-CPSI to demonstrate a clinically meaningful decrease in disability and distress due to pelvic floor dysfunction.    Baseline  PFDI: 153/300 FISI: 23/61 FOTO: 75% impaired, Female NIH-CPSI: 18/43 (42%)    Time  10    Period  Weeks    Status  New    Target Date  02/23/20      PT LONG TERM GOAL #3   Title  Patient will report no pain with intercourse to demonstrate improved functional ability.    Baseline  Pt. has pain with deeper thrusting, has had this as long as she has been sexually active.    Time  10    Period  Weeks    Status  New    Target Date  02/23/20      PT LONG TERM  GOAL #4   Title  Patient will report no episodes of SUI or UUI over the course of the prior two weeks to demonstrate improved functional ability.    Baseline  Pt. having to run to the bathroom every time. Has SUI with cough, laugh, and occasionally wth bend/lift (drops)    Time  10    Period  Weeks    Status  New    Target Date  02/23/20      PT LONG TERM GOAL #5   Title  Patient will report having BM's at least every-other day with consistency between [facility] stool scale 3-5 over the prior week to demonstrate decreased bowel dysfunction.    Baseline  Pt. is having a liquid (type 7) BM whenever she eats a meal, restricting food intake due to fear of increased pain with BM's    Time  10    Period  Weeks    Status  New    Target Date  02/23/20             Plan - 12/15/19 1022    Clinical Impression Statement  Pt. is a 34 y/o female who presents today with cheif c/o pelvic pain, mixed urinary incontinence, FI, dyspareunia, and chronic LBP. Her PHM is significant for Anxiety, h/o anorexia, insomnia, chronic LBP, tachycardia, 2 c-sections, a cholycystectomy, recent fall in ~ June 2020 that increased pain and Sx., and one miscarriage. Her clinical exam revealed RLE long, L up-slip and posterior rotation. decreased c-section and galbladder removal scar mobility and decreased fascial mobility along L>R upper and lower quadrants, flexed and L deviated coccyx, TTP to B OI, hip flexors and extensors, and L>R lumbar extensors as well as anterior pelvic tilt. She will benefit from an interdisciplinary and inter-regional dependence approach with physical therapy to address the noted deficits and to continue to assess for and address any other potential causes of Sx.    Personal Factors and Comorbidities  Comorbidity 3+    Comorbidities  Anxiety, h/o anorexia, insomnia, chronic LBP, tachycardia    Examination-Activity Limitations  Sit;Caring for Others;Locomotion  Level;Carry;Bend;Lift;Stand;Continence;Toileting    Examination-Participation Restrictions  Interpersonal Relationship;Yard Work;Laundry;Community Activity;Driving;Cleaning    Stability/Clinical Decision Making  Unstable/Unpredictable    Clinical Decision Making  High    Rehab Potential  Good    PT Frequency  1x / week    PT Duration  Other (comment)   10 weeks   PT Treatment/Interventions  ADLs/Self Care Home Management;Aquatic Therapy;Biofeedback;Moist Heat;Electrical Stimulation;Cryotherapy;Traction;Ultrasound;Therapeutic activities;Functional mobility training;Therapeutic exercise;Neuromuscular re-education;Patient/family education;Cognitive remediation;Manual techniques;Scar mobilization;Taping;Dry needling;Passive range of motion;Spinal Manipulations;Joint Manipulations    PT Next Visit Plan  teach self scar release, TP releaes vs. DN for spasms and align pelvis, give heel-lift.    PT Home Exercise Plan  diaphragmatic breathing, step 1 of urge-suppression    Recommended Other Services  sexual counselling/anxiety medication, dietician, testing for h pylori?    Consulted  and Agree with Plan of Care  Patient       Patient will benefit from skilled therapeutic intervention in order to improve the following deficits and impairments:  Decreased endurance, Increased muscle spasms, Decreased range of motion, Decreased scar mobility, Impaired perceived functional ability, Improper body mechanics, Decreased activity tolerance, Decreased coordination, Decreased strength, Increased fascial restricitons, Impaired flexibility, Postural dysfunction, Pain  Visit Diagnosis: Other muscle spasm  Abnormal posture  Sacrococcygeal disorders, not elsewhere classified     Problem List Patient Active Problem List   Diagnosis Date Noted  . Calculus of gallbladder without cholecystitis without obstruction 12/31/2018   Willa Rough DPT, ATC Willa Rough 12/15/2019, 10:37 AM  [place] MAIN [facility] SERVICES [address] [place], [place], [address] Phone: [phone]   Fax:  [phone]  Name: Katelyn Jefferson MRN: [id number] Date of Birth: 23-Aug-1986

## 2019-12-15 NOTE — ED Triage Notes (Signed)
Patient in today c/o 4 days history of dysuria and urinary frequency.

## 2019-12-15 NOTE — Patient Instructions (Signed)
Stabilization: Diaphragmatic Breathing    Lie with knees bent, feet flat. Place one hand on stomach, other on chest. Breathe deeply through nose, lifting belly hand without any motion of hand on chest. Practice this as often as you can and for at least 5 min. Before going to sleep.  Urge supression technique: DO THE FIRST STEP ONLY FOR NOW!!!! 1) Take a deep breath to convince yourself that you are in control and calm the nervous system.  2) Do 5 "quick-flick" kegels (pelvic floor muscle contractions) and re-assess the urge. Repeat another set if urge is still present. 3) Once the urge has decreased, start walking calmly to the the bathroom. Stop and repeat steps 1 and 2 as many times as needed until you can successfully get to the toilet. 4) Only once seated, take a deep breath and allow the pelvic floor muscles to relax and allow for the urine to flow.    Do not be discouraged if you are not successful the first couple times, this is normal and it will take practice but remember that YOU are in control. Start by practicing this at home where you do not have to worry as much if there were to be an accident. Allowing yourself to get rushed or nervous puts the bladder back in control and will not allow the technique to work.

## 2019-12-15 NOTE — ED Provider Notes (Signed)
HPI  SUBJECTIVE:  Katelyn Jefferson Frommelt is a 34 y.o. female who presents with UTI symptoms for the past 4 days.  She reports dysuria, urgency, frequency, cloudy and odorous urine.  No hematuria, nausea, vomiting, fevers, abdominal, back, pelvic pain.  No vaginal bleeding, odor, discharge, genital rash or itching.  She is in a long-term monogamous relationship with a female who is asymptomatic.  STDs are not a concern.  No recent antibiotics, perfumed soaps or body washes.  She tried Azo with improvement in her symptoms.  She has also been pushing fluids.  Symptoms are worse with urination.  States that she currently has BV, has MetroGel, but has not yet started it.  She has a history of frequent urinary tract infections, no history of pyelonephritis, nephrolithiasis.  Also has a History of vaginal yeast infections.  Remote history of trichomoniasis.  No history of gonorrhea, chlamydia, HIV, HSV, syphilis,.  No history of diabetes, hypertension.  LMP: 2 weeks ago.  Denies the possibility of being pregnant.  PMD: [facility].   Past Medical History:  Diagnosis Date  . Abdominal mass   . Anxiety   . Anxiety   . Calculus of gallbladder without cholecystitis without obstruction 12/31/2018  . Decreased fetal movement 10/27/2015  . Depression   . Dysrhythmia    tachycardia  . Heart murmur    Congential heart murmur  No longer present  . History of poor fetal growth 08/31/2015  . Miscarriage   . Single umbilical artery 10/12/2015    Past Surgical History:  Procedure Laterality Date  . CESAREAN SECTION N/A 02/28/2016   Procedure: CESAREAN SECTION;  Surgeon: Suzy Bouchard, MD;  Location: [facility] ORS;  Service: Obstetrics;  Laterality: N/A;  . CESAREAN SECTION  2010  . CHOLECYSTECTOMY N/A 01/15/2019   Procedure: LAPAROSCOPIC CHOLECYSTECTOMY;  Surgeon: Ancil Linsey, MD;  Location: [facility] ORS;  Service: General;  Laterality: N/A;  . COLONOSCOPY  34 years old  . EXCISION OF ABDOMINAL WALL TUMOR N/A  11/19/2017   Procedure: EXCISION OF ABDOMINAL WALL MASS;  Surgeon: Kieth Brightly, MD;  Location: [facility] ORS;  Service: General;  Laterality: N/A;  . TUBAL LIGATION Bilateral 02/28/2016   Procedure: BILATERAL TUBAL LIGATION;  Surgeon: Suzy Bouchard, MD;  Location: [facility] ORS;  Service: Obstetrics;  Laterality: Bilateral;  . WISDOM TOOTH EXTRACTION      Family History  Problem Relation Age of Onset  . Stroke Mother   . Cancer Paternal Grandfather   . Hypertension Father     Social History   Tobacco Use  . Smoking status: Current Every Day Smoker    Packs/day: 0.50    Years: 10.00    Pack years: 5.00    Types: Cigarettes  . Smokeless tobacco: Never Used  Substance Use Topics  . Alcohol use: No  . Drug use: No    No current facility-administered medications for this encounter.  Current Outpatient Medications:  .  acetaminophen (TYLENOL) 500 MG tablet, Take 1,000 mg by mouth every 8 (eight) hours as needed., Disp: , Rfl:  .  Melatonin 10 MG CAPS, Take by mouth., Disp: , Rfl:  .  sertraline (ZOLOFT) 100 MG tablet, Take 100 mg by mouth daily., Disp: , Rfl:  .  tiZANidine (ZANAFLEX) 2 MG tablet, Take 2 mg by mouth every 6 (six) hours as needed for muscle spasms., Disp: , Rfl:  .  fluconazole (DIFLUCAN) 150 MG tablet, Take 1 tablet (150 mg total) by mouth once for 1 dose.  1 tab po x 1. May repeat in 72 hours if no improvement, Disp: 2 tablet, Rfl: 1 .  nitrofurantoin, macrocrystal-monohydrate, (MACROBID) 100 MG capsule, Take 1 capsule (100 mg total) by mouth 2 (two) times daily. X 5 days, Disp: 10 capsule, Rfl: 0 .  phenazopyridine (PYRIDIUM) 200 MG tablet, Take 1 tablet (200 mg total) by mouth 3 (three) times daily as needed for pain., Disp: 6 tablet, Rfl: 0  Allergies  Allergen Reactions  . Cephalexin Other (See Comments)    diarrhea  . Sulfa Antibiotics Hives  . Sulfur Hives     ROS  As noted in HPI.   Physical Exam  BP 107/80 (BP Location: Left Arm)    Pulse 98   Temp 97.9 F (36.6 C) (Oral)   Resp 16   Ht 5\' 4"  (1.626 m)   Wt 72.6 kg   LMP 12/01/2019 (Approximate)   SpO2 98%   BMI 27.46 kg/m   Constitutional: Well developed, well nourished, no acute distress Eyes:  EOMI, conjunctiva normal bilaterally HENT: Normocephalic, atraumatic,mucus membranes moist Respiratory: Normal inspiratory effort Cardiovascular: Normal rate GI: nondistended soft.  Positive suprapubic tenderness.  No flank tenderness. Back: No CVAT skin: No rash, skin intact Musculoskeletal: no deformities Neurologic: Alert & oriented x 3, no focal neuro deficits Psychiatric: Speech and behavior appropriate   ED Course   Medications - No data to display  Orders Placed This Encounter  Procedures  . Urine culture    Standing Status:   Standing    Number of Occurrences:   1    Order Specific Question:   List patient's active antibiotics    Answer:   macrobid  . Urinalysis, Complete w Microscopic    Standing Status:   Standing    Number of Occurrences:   1    Results for orders placed or performed during the hospital encounter of 12/15/19 (from the past 24 hour(s))  Urinalysis, Complete w Microscopic     Status: Abnormal   Collection Time: 12/15/19  4:29 PM  Result Value Ref Range   Color, Urine YELLOW YELLOW   APPearance CLOUDY (A) CLEAR   Specific Gravity, Urine >1.030 (H) 1.005 - 1.030   pH 5.5 5.0 - 8.0   Glucose, UA 100 (A) NEGATIVE mg/dL   Hgb urine dipstick TRACE (A) NEGATIVE   Bilirubin Urine NEGATIVE NEGATIVE   Ketones, ur TRACE (A) NEGATIVE mg/dL   Protein, ur NEGATIVE NEGATIVE mg/dL   Nitrite POSITIVE (A) NEGATIVE   Leukocytes,Ua TRACE (A) NEGATIVE   Squamous Epithelial / LPF 6-10 0 - 5   WBC, UA 21-50 0 - 5 WBC/hpf   RBC / HPF 6-10 0 - 5 RBC/hpf   Bacteria, UA MANY (A) NONE SEEN   Budding Yeast PRESENT    No results found.  ED Clinical Impression  1. Acute cystitis with hematuria   2. Vaginal yeast infection      ED  Assessment/Plan  Patient appears to have a UTI.  She also has a yeast infection.  No evidence of pyelonephritis.  Home with Pyridium, Macrobid sending urine culture off to confirm antibiotic choice.  Also Diflucan.  She states that she has MetroGel for the BV already.  Follow-up with [facility] as needed.   previous labs reviewed.  Past 2 urine cultures were contaminated.  Discussed labs, MDM, treatment plan, and plan for follow-up with patient. patient agrees with plan.   Meds ordered this encounter  Medications  . nitrofurantoin, macrocrystal-monohydrate, (MACROBID) 100 MG  capsule    Sig: Take 1 capsule (100 mg total) by mouth 2 (two) times daily. X 5 days    Dispense:  10 capsule    Refill:  0  . phenazopyridine (PYRIDIUM) 200 MG tablet    Sig: Take 1 tablet (200 mg total) by mouth 3 (three) times daily as needed for pain.    Dispense:  6 tablet    Refill:  0  . fluconazole (DIFLUCAN) 150 MG tablet    Sig: Take 1 tablet (150 mg total) by mouth once for 1 dose. 1 tab po x 1. May repeat in 72 hours if no improvement    Dispense:  2 tablet    Refill:  1    *This clinic note was created using [employer]. Therefore, there may be occasional mistakes despite careful proofreading.   ?    Melynda Ripple, MD 12/16/19 1046

## 2019-12-18 LAB — URINE CULTURE: Culture: >=100000 — AB

## 2019-12-22 ENCOUNTER — Other Ambulatory Visit: Payer: Self-pay

## 2019-12-22 ENCOUNTER — Ambulatory Visit: Payer: Medicaid Other

## 2019-12-22 DIAGNOSIS — M533 Sacrococcygeal disorders, not elsewhere classified: Secondary | ICD-10-CM

## 2019-12-22 DIAGNOSIS — R293 Abnormal posture: Secondary | ICD-10-CM

## 2019-12-22 DIAGNOSIS — M62838 Other muscle spasm: Secondary | ICD-10-CM | POA: Diagnosis not present

## 2019-12-22 NOTE — Therapy (Signed)
[place] MAIN [facility] SERVICES [address] [place], [place], [address] Phone: [phone]   Fax:  [phone]  Physical Therapy Treatment  The patient has been informed of current processes in place at Outpatient Rehab to protect patients from Covid-19 exposure including social distancing, schedule modifications, and new cleaning procedures. After discussing their particular risk with a therapist based on the patient's personal risk factors, the patient has decided to proceed with in-person therapy.   Patient Details  Name: Katelyn Jefferson MRN: [id number] Date of Birth: 07/16/1986 No data recorded  Encounter Date: 12/22/2019  PT End of Session - 12/22/19 1055    Visit Number  2    Number of Visits  10    Date for PT Re-Evaluation  01/12/20    Authorization Type  mcaid    Authorization Time Period  12/15/19 through 01/12/20    Authorization - Visit Number  2    Authorization - Number of Visits  4    PT Start Time  0805    PT Stop Time  0905    PT Time Calculation (min)  60 min    Activity Tolerance  Patient tolerated treatment well    Behavior During Therapy  [facility] for tasks assessed/performed       Past Medical History:  Diagnosis Date  . Abdominal mass   . Anxiety   . Anxiety   . Calculus of gallbladder without cholecystitis without obstruction 12/31/2018  . Decreased fetal movement 10/27/2015  . Depression   . Dysrhythmia    tachycardia  . Heart murmur    Congential heart murmur  No longer present  . History of poor fetal growth 08/31/2015  . Miscarriage   . Single umbilical artery 16/0/7371    Past Surgical History:  Procedure Laterality Date  . CESAREAN SECTION N/A 02/28/2016   Procedure: CESAREAN SECTION;  Surgeon: Boykin Nearing, MD;  Location: [facility] ORS;  Service: Obstetrics;  Laterality: N/A;  . CESAREAN SECTION  2010  . CHOLECYSTECTOMY N/A 01/15/2019   Procedure: LAPAROSCOPIC CHOLECYSTECTOMY;  Surgeon: Vickie Epley,  MD;  Location: [facility] ORS;  Service: General;  Laterality: N/A;  . COLONOSCOPY  34 years old  . 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 TUBAL LIGATION Bilateral 02/28/2016   Procedure: BILATERAL TUBAL LIGATION;  Surgeon: Boykin Nearing, MD;  Location: [facility] ORS;  Service: Obstetrics;  Laterality: Bilateral;  . WISDOM TOOTH EXTRACTION      There were no vitals filed for this visit.    Pelvic Floor Physical Therapy Treatment Note  SCREENING  Changes in medications, allergies, or medical history?: none    SUBJECTIVE  Patient reports: No changes other than she got the UTI fixed since last visit. She Has been able to use the belly breathing to decrease UUI when trying to wash dishes and it helps her get to sleep easier.   Precautions:  Scar tissue from multiple abdominal areas.  Pain update: Location of pain: Low back, B hips, and vaginal area. Current pain: 4/10  Max pain: 10/10 Least pain: 4/10 Nature of pain:throbbing, constant  **following treatment the Pt. Described feeling "looser" and "less tight on the right side of her back/pelvis.  Patient Goals: Stop the pain and be able to exercise without being in pain. Be able to go walk around the park.  OBJECTIVE  Changes in: Posture/Observations:    Range of Motion/Flexibilty:    Strength/MMT:  LE MMT:  Pelvic floor:  Abdominal:   Palpation: TTP to R Iliacus with radiating pain into the pelvis and up to the back.  Gait Analysis:  INTERVENTIONS THIS SESSION: Manual: Performed STM and TP release to R Iliacus to to decrease spasm and pain and allow for improved balance of musculature for improved function and decreased symptoms.   Therex: Educated on and practiced hip-flexor hold-release and stretch at the EOB To maintain and improve muscle length and  allow for improved balance of musculature for long-term symptom relief.  Self-care: Educated on and practiced self c-section scar release to help decrease tension on nerve roots to allow for decreased pain/spasm and UI.  Dry-needling: Performed TPDN with a . needle and standard approach as described below to decrease spasm and pain and allow for improved balance of musculature for improved function and decreased symptoms.   Total time: 60 min.                   Trigger Point Dry Needling - 12/22/19 0001    Consent Given?  Yes    Education Handout Provided  No    Muscles Treated Back/Hip  Iliacus    Dry Needling Comments  Right    Iliacus Response  Twitch response elicited;Palpable increased muscle length             PT Short Term Goals - 12/15/19 1005      PT SHORT TERM GOAL #1   Title  Patient will demonstrate improved pelvic alignment and balance of musculature surrounding the pelvis to facilitate decreased PFM spasms and decrease pelvic pain.    Baseline  Pt demonstrates LLD with  RLE long by ~1 cm, L posterior rotation and up-slip as well as spasms surrounding and within the pelvis.    Time  5    Period  Weeks    Status  New    Target Date  01/19/20      PT SHORT TERM GOAL #2   Title  Patient will demonstrate HEP x1 in the clinic and will be IND with stress-management techniques to demonstrate understanding and proper form to allow for further improvement of Sx.    Baseline  Pt. lacks knowledge of therepeutic exercises to decrease her pain and Sx.    Time  3    Period  Weeks    Status  New    Target Date  01/05/20      PT SHORT TERM GOAL #3   Title  Patient will demonstrate a coordinated contraction, relaxation, and bulge of the pelvic floor muscles to demonstrate functional recruitment and motion and allow for further strengthening.    Baseline  Pt. having UUI, SUI, and FI in setting of spasms indicating likely discoordination of PFM.    Time  5     Period  Weeks    Status  New    Target Date  01/19/20      PT SHORT TERM GOAL #4   Title  Pt. will eat meals daily and intake adequate fluid to maintain improved health and  nutrition without increased pain or FI.    Baseline  Pt. only eating a meal every 2-3 days with "light snacks" occasionally due to pain and FI.    Time  5    Period  Weeks    Status  New    Target Date  01/19/20  PT SHORT TERM GOAL #5   Title  Pt. will be compliant with wearing heel-lift during ~90% of the day while being active to help prevent return of spasms and malalignment.    Baseline  Pt. unaware on LLD with RLE long.    Time  3    Period  Weeks    Status  New    Target Date  01/05/20        PT Long Term Goals - 12/15/19 0954      PT LONG TERM GOAL #1   Title  Patient will describe pain no greater than 2/10 during and following walking or light extercise for 30 min. to demonstrate improved functional ability.    Baseline  Pt. does not exercise due to increased pain.    Time  10    Period  Weeks    Status  New    Target Date  02/23/20      PT LONG TERM GOAL #2   Title  Patient will score at or below 63/300 on the PFDI, 7/61 on the FISI, 25% on the FOTO, AND 21% on the Female NIH-CPSI to demonstrate a clinically meaningful decrease in disability and distress due to pelvic floor dysfunction.    Baseline  PFDI: 153/300 FISI: 23/61 FOTO: 75% impaired, Female NIH-CPSI: 18/43 (42%)    Time  10    Period  Weeks    Status  New    Target Date  02/23/20      PT LONG TERM GOAL #3   Title  Patient will report no pain with intercourse to demonstrate improved functional ability.    Baseline  Pt. has pain with deeper thrusting, has had this as long as she has been sexually active.    Time  10    Period  Weeks    Status  New    Target Date  02/23/20      PT LONG TERM GOAL #4   Title  Patient will report no episodes of SUI or UUI over the course of the prior two weeks to demonstrate improved  functional ability.    Baseline  Pt. having to run to the bathroom every time. Has SUI with cough, laugh, and occasionally wth bend/lift (drops)    Time  10    Period  Weeks    Status  New    Target Date  02/23/20      PT LONG TERM GOAL #5   Title  Patient will report having BM's at least every-other day with consistency between [facility] stool scale 3-5 over the prior week to demonstrate decreased bowel dysfunction.    Baseline  Pt. is having a liquid (type 7) BM whenever she eats a meal, restricting food intake due to fear of increased pain with BM's    Time  10    Period  Weeks    Status  New    Target Date  02/23/20            Plan - 12/22/19 1056    Clinical Impression Statement  Pt. Responded well to all interventions today, demonstrating improved hip EXT ROM on the R and decreased tension on the low back and lumbosacral nerves as well as understanding and correct performance of all education and exercises provided today. They will continue to benefit from skilled physical therapy to work toward remaining goals and maximize function as well as decrease likelihood of symptom increase or recurrence.     PT Next Visit Plan  review self scar release (give handout), DN for spasms in L>R, PA mobs around the sacrum, and align pelvis, give heel-lift.    PT Home Exercise Plan  diaphragmatic breathing, step 1 of urge-suppression,hip-flexor stretch, self scar mobility (handout not provided)    Recommended Other Services  sexual counselling/anxiety medication, dietician, testing for h pylori?    Consulted and Agree with Plan of Care  Patient       Patient will benefit from skilled therapeutic intervention in order to improve the following deficits and impairments:     Visit Diagnosis: Other muscle spasm  Abnormal posture  Sacrococcygeal disorders, not elsewhere classified     Problem List Patient Active Problem List   Diagnosis Date Noted  . Calculus of gallbladder without  cholecystitis without obstruction 12/31/2018   Cleophus Molt DPT, ATC Cleophus Molt 12/22/2019, 11:05 AM  [place] [facility] MAIN [facility] SERVICES [address] [place], [place], [address] Phone: [phone]   Fax:  [phone]  Name: Katelyn Jefferson MRN: [id number] Date of Birth: 17-May-1986

## 2019-12-22 NOTE — Patient Instructions (Signed)
° ° ° °  Bring both knees up to your chest and then hold the one farthest from the edge of the table/bed and let the other relax toward the floor until stretch is felt through the front of the hip. °  °Hold-relax: Gently lift the knee of the down leg up ~ 1/2 and inch and hold for 5 seconds, then let it relax all the way for a second and repeat 4 more times to decrease resting tension in the muscle. °  °Stretch: Let the leg relax and feel a stretch across the front of the hip/thigh as you take belly breaths. °Hold for __5__ deep belly breaths. Relax. °Repeat __2-3__ times per side.  °  °Do this __1-2__ times per day. °  ° °

## 2019-12-29 ENCOUNTER — Ambulatory Visit: Payer: Medicaid Other

## 2019-12-29 ENCOUNTER — Other Ambulatory Visit: Payer: Self-pay

## 2019-12-29 DIAGNOSIS — M533 Sacrococcygeal disorders, not elsewhere classified: Secondary | ICD-10-CM

## 2019-12-29 DIAGNOSIS — M62838 Other muscle spasm: Secondary | ICD-10-CM | POA: Diagnosis not present

## 2019-12-29 DIAGNOSIS — R293 Abnormal posture: Secondary | ICD-10-CM

## 2019-12-29 NOTE — Therapy (Signed)
[place] MAIN [facility] SERVICES [address] [place], [place], [address] Phone: [phone]   Fax:  [phone]  Physical Therapy Treatment  The patient has been informed of current processes in place at Outpatient Rehab to protect patients from Covid-19 exposure including social distancing, schedule modifications, and new cleaning procedures. After discussing their particular risk with a therapist based on the patient's personal risk factors, the patient has decided to proceed with in-person therapy.   Patient Details  Name: Katelyn Jefferson MRN: [id number] Date of Birth: 1986-09-26 No data recorded  Encounter Date: 12/29/2019  PT End of Session - 12/29/19 1308    Visit Number  3    Number of Visits  10    Date for PT Re-Evaluation  01/12/20    Authorization Type  mcaid    Authorization Time Period  12/15/19 through 01/12/20    Authorization - Visit Number  3    Authorization - Number of Visits  4    PT Start Time  0905    PT Stop Time  1000    PT Time Calculation (min)  55 min    Activity Tolerance  Patient tolerated treatment well    Behavior During Therapy  [facility] for tasks assessed/performed       Past Medical History:  Diagnosis Date  . Abdominal mass   . Anxiety   . Anxiety   . Calculus of gallbladder without cholecystitis without obstruction 12/31/2018  . Decreased fetal movement 10/27/2015  . Depression   . Dysrhythmia    tachycardia  . Heart murmur    Congential heart murmur  No longer present  . History of poor fetal growth 08/31/2015  . Miscarriage   . Single umbilical artery 83/05/6293    Past Surgical History:  Procedure Laterality Date  . CESAREAN SECTION N/A 02/28/2016   Procedure: CESAREAN SECTION;  Surgeon: Boykin Nearing, MD;  Location: [facility] ORS;  Service: Obstetrics;  Laterality: N/A;  . CESAREAN SECTION  2010  . CHOLECYSTECTOMY N/A 01/15/2019   Procedure: LAPAROSCOPIC CHOLECYSTECTOMY;  Surgeon: Vickie Epley,  MD;  Location: [facility] ORS;  Service: General;  Laterality: N/A;  . COLONOSCOPY  34 years old  . 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 TUBAL LIGATION Bilateral 02/28/2016   Procedure: BILATERAL TUBAL LIGATION;  Surgeon: Boykin Nearing, MD;  Location: [facility] ORS;  Service: Obstetrics;  Laterality: Bilateral;  . WISDOM TOOTH EXTRACTION      There were no vitals filed for this visit.   Pelvic Floor Physical Therapy Treatment Note  SCREENING  Changes in medications, allergies, or medical history?: none    SUBJECTIVE  Patient reports: Her pain has been "stupid" but she thinks this is in part due to her painting the walls. Has been stretching, which helps some until the next day.  Precautions:  Scar tissue from multiple abdominal areas.  Pain update: Location of pain: Low back, B hips, and vaginal area. Current pain: 5/10  Max pain: 7/10 Least pain: 5/10 Nature of pain:throbbing, constant  **No pain in standing, 2/10 in sitting  Patient Goals: Stop the pain and be able to exercise without being in pain. Be able to go walk around the park.   OBJECTIVE  Changes in: Posture/Observations:  R anterior rotation and R pubic symphysis low.  Range of Motion/Flexibilty:  Decreased  mobility and pain with pressure through all sacral borders and lumbar spine.  Strength/MMT:  LE MMT:  Pelvic floor:  Abdominal:   Palpation: TTP to R pectineus and rectus abdominus, B ~ L3-5 erector spinae and multifidus, Piriformis, and coccygeus.    Gait Analysis:  INTERVENTIONS THIS SESSION: Manual: Performed STM and TP release to R pectineus and rectus abdominus, B ~ L3-5 erector spinae and multifidus, Piriformis, and coccygeus to decrease spasm and pain and allow for improved balance of musculature for improved function and decreased  symptoms. Performed grade 3-4 PA mobs through all sacral borders and lumbar spine as well as to the R innominate in a posteriorly rotating and cephalic direction to improve pelvic alignment, improve mobility of the joints and surrounding connective tissue and decrease pressure on nerve roots for improved conductivity and function of down-stream tissues.   Therex: Educated on and practiced child's pose stretch and MET correction for R anterior rotation to improve pelvic alignment and to maintain and improve muscle length and allow for improved balance of musculature for long-term symptom relief.   Total time: 55 min.                             PT Short Term Goals - 12/15/19 1005      PT SHORT TERM GOAL #1   Title  Patient will demonstrate improved pelvic alignment and balance of musculature surrounding the pelvis to facilitate decreased PFM spasms and decrease pelvic pain.    Baseline  Pt demonstrates LLD with  RLE long by ~1 cm, L posterior rotation and up-slip as well as spasms surrounding and within the pelvis.    Time  5    Period  Weeks    Status  New    Target Date  01/19/20      PT SHORT TERM GOAL #2   Title  Patient will demonstrate HEP x1 in the clinic and will be IND with stress-management techniques to demonstrate understanding and proper form to allow for further improvement of Sx.    Baseline  Pt. lacks knowledge of therepeutic exercises to decrease her pain and Sx.    Time  3    Period  Weeks    Status  New    Target Date  01/05/20      PT SHORT TERM GOAL #3   Title  Patient will demonstrate a coordinated contraction, relaxation, and bulge of the pelvic floor muscles to demonstrate functional recruitment and motion and allow for further strengthening.    Baseline  Pt. having UUI, SUI, and FI in setting of spasms indicating likely discoordination of PFM.    Time  5    Period  Weeks    Status  New    Target Date  01/19/20      PT SHORT TERM GOAL  #4   Title  Pt. will eat meals daily and intake adequate fluid to maintain improved health and  nutrition without increased pain or FI.    Baseline  Pt. only eating a meal every 2-3 days with "light snacks" occasionally due to pain and FI.    Time  5    Period  Weeks    Status  New    Target Date  01/19/20      PT SHORT TERM GOAL #5   Title  Pt. will be compliant with wearing heel-lift during ~90% of the day while being active to help prevent return of  spasms and malalignment.    Baseline  Pt. unaware on LLD with RLE long.    Time  3    Period  Weeks    Status  New    Target Date  01/05/20        PT Long Term Goals - 12/15/19 0954      PT LONG TERM GOAL #1   Title  Patient will describe pain no greater than 2/10 during and following walking or light extercise for 30 min. to demonstrate improved functional ability.    Baseline  Pt. does not exercise due to increased pain.    Time  10    Period  Weeks    Status  New    Target Date  02/23/20      PT LONG TERM GOAL #2   Title  Patient will score at or below 63/300 on the PFDI, 7/61 on the FISI, 25% on the FOTO, AND 21% on the Female NIH-CPSI to demonstrate a clinically meaningful decrease in disability and distress due to pelvic floor dysfunction.    Baseline  PFDI: 153/300 FISI: 23/61 FOTO: 75% impaired, Female NIH-CPSI: 18/43 (42%)    Time  10    Period  Weeks    Status  New    Target Date  02/23/20      PT LONG TERM GOAL #3   Title  Patient will report no pain with intercourse to demonstrate improved functional ability.    Baseline  Pt. has pain with deeper thrusting, has had this as long as she has been sexually active.    Time  10    Period  Weeks    Status  New    Target Date  02/23/20      PT LONG TERM GOAL #4   Title  Patient will report no episodes of SUI or UUI over the course of the prior two weeks to demonstrate improved functional ability.    Baseline  Pt. having to run to the bathroom every time. Has SUI with  cough, laugh, and occasionally wth bend/lift (drops)    Time  10    Period  Weeks    Status  New    Target Date  02/23/20      PT LONG TERM GOAL #5   Title  Patient will report having BM's at least every-other day with consistency between [facility] stool scale 3-5 over the prior week to demonstrate decreased bowel dysfunction.    Baseline  Pt. is having a liquid (type 7) BM whenever she eats a meal, restricting food intake due to fear of increased pain with BM's    Time  10    Period  Weeks    Status  New    Target Date  02/23/20            Plan - 12/29/19 1308    Clinical Impression Statement  Pt. Responded well to all interventions today, demonstrating improved pain, decreased spasms, improved sacral and lumbar mobility and alignment, as well as understanding and correct performance of all education and exercises provided today. They will continue to benefit from skilled physical therapy to work toward remaining goals and maximize function as well as decrease likelihood of symptom increase or recurrence.     PT Next Visit Plan  review self scar release (give handout), re-assess pelvic alignment and D/C MET correction? and align pelvis, give heel-lift.    PT Home Exercise Plan  diaphragmatic breathing, step 1 of urge-suppression,hip-flexor stretch, self  scar mobility (handout not provided), MET correction for R anterior rotation, child's pose.    Consulted and Agree with Plan of Care  Patient       Patient will benefit from skilled therapeutic intervention in order to improve the following deficits and impairments:     Visit Diagnosis: Other muscle spasm  Abnormal posture  Sacrococcygeal disorders, not elsewhere classified     Problem List Patient Active Problem List   Diagnosis Date Noted  . Calculus of gallbladder without cholecystitis without obstruction 12/31/2018   Willa Rough DPT, ATC Willa Rough 12/29/2019, 1:11 PM  [place] MAIN [facility] SERVICES [address] [place], [place], [address] Phone: [phone]   Fax:  [phone]  Name: Katelyn Jefferson MRN: [id number] Date of Birth: 07-Aug-1986

## 2019-12-29 NOTE — Patient Instructions (Signed)
  Pelvic Rotation: Contract / Relax (Supine)  MET to Correct Right Anteriorly Rotated/Left Posteriorly Rotated Innominate   Begin laying on your back with your feet at 90 degrees. Put a dowel/broomstick  through your legs, behind your right knee and in front of your left knee. Stabilize the dowel on ether side with your hands.  Press down with the right leg and up with the left leg. Hold for 5 seconds  then slowly relax. Repeat 5 times.  Child's Pose Pelvic Floor Lengthening    Sit in knee-chest position and reach arms forward. Separate knees for comfort. Hold position for _5__ breaths. Repeat _2-3__ times. Do _1-2__ times per day.

## 2020-01-05 ENCOUNTER — Ambulatory Visit: Payer: Medicaid Other

## 2020-01-19 ENCOUNTER — Ambulatory Visit: Payer: Medicaid Other | Attending: Obstetrics and Gynecology

## 2020-01-26 ENCOUNTER — Ambulatory Visit: Payer: Medicaid Other

## 2020-02-02 ENCOUNTER — Ambulatory Visit: Payer: Medicaid Other

## 2020-05-26 ENCOUNTER — Ambulatory Visit: Payer: Medicaid Other

## 2020-05-26 ENCOUNTER — Other Ambulatory Visit: Payer: Self-pay

## 2020-05-26 ENCOUNTER — Ambulatory Visit
Admission: EM | Admit: 2020-05-26 | Discharge: 2020-05-26 | Disposition: A | Discharge status: Home / Self Care | Payer: Medicaid Other | Attending: Physician Assistant | Admitting: Physician Assistant

## 2020-05-26 ENCOUNTER — Encounter: Payer: Self-pay | Admitting: Emergency Medicine

## 2020-05-26 DIAGNOSIS — M26621 Arthralgia of right temporomandibular joint: Secondary | ICD-10-CM

## 2020-05-26 DIAGNOSIS — H9201 Otalgia, right ear: Secondary | ICD-10-CM | POA: Diagnosis not present

## 2020-05-26 DIAGNOSIS — H6690 Otitis media, unspecified, unspecified ear: Secondary | ICD-10-CM

## 2020-05-26 MED ORDER — AMOXICILLIN 400 MG/5ML PO SUSR: ORAL | 0 refills | Status: DC

## 2020-05-26 NOTE — ED Triage Notes (Signed)
Patient c/o right sided headache, right sided neck pain and right sided jaw pain that started yesterday.  Patient has been taking Claritin and Tylenol.  Patient denies fevers.

## 2020-05-28 NOTE — ED Provider Notes (Signed)
[facility]    CSN: [id number] Arrival date & time: 05/26/20  1541      History   Chief Complaint Chief Complaint  Patient presents with  . Headache  . Neck Pain  . Otalgia    HPI Katelyn Jefferson is a 34 y.o. female.   Pt reports she has pain in her right ear and right jaw.  Pt reports pain is worse with openning and closing mouth.  Pt had a dental check up and teeth were normal.    The history is provided by the patient. No language interpreter was used.  Headache Radiates to:  Face Progression:  Worsening Chronicity:  New Similar to prior headaches: no   Relieved by:  Nothing Worsened by:  Nothing Associated symptoms: ear pain and neck pain   Neck Pain Associated symptoms: headaches   Otalgia Associated symptoms: headaches and neck pain     Past Medical History:  Diagnosis Date  . Abdominal mass   . Anxiety   . Anxiety   . Calculus of gallbladder without cholecystitis without obstruction 12/31/2018  . Decreased fetal movement 10/27/2015  . Depression   . Dysrhythmia    tachycardia  . Heart murmur    Congential heart murmur  No longer present  . History of poor fetal growth 08/31/2015  . Miscarriage   . 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 Calculus of gallbladder without cholecystitis without obstruction 12/31/2018    Past Surgical History:  Procedure Laterality Date  . CESAREAN SECTION N/A 02/28/2016   Procedure: CESAREAN SECTION;  Surgeon: Suzy Bouchard, MD;  Location: [facility];  Service: Obstetrics;  Laterality: N/A;  . CESAREAN SECTION  2010  . CHOLECYSTECTOMY N/A 01/15/2019   Procedure: LAPAROSCOPIC CHOLECYSTECTOMY;  Surgeon: Ancil Linsey, MD;  Location: [facility];  Service: General;  Laterality: N/A;  . COLONOSCOPY  34 years old  . 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 TUBAL LIGATION Bilateral 02/28/2016   Procedure: BILATERAL TUBAL LIGATION;  Surgeon: Suzy Bouchard, MD;  Location: [facility];  Service: Obstetrics;  Laterality: Bilateral;  . WISDOM TOOTH EXTRACTION      OB History    Gravida  3   Para  2   Term  1   Preterm  1   AB  1   Living  2     SAB  1   TAB      Ectopic      Multiple      Live Births  2        Obstetric Comments  1st Menstrual Cycle:   15 1st Pregnancy:  22          Home Medications    Prior to Admission medications   Medication Sig Start Date End Date Taking? Authorizing Provider  acetaminophen (TYLENOL) 500 MG tablet Take 1,000 mg by mouth every 8 (eight) hours as needed.   Yes [provider]  Melatonin 10 MG CAPS Take by mouth.   Yes [provider]  sertraline (ZOLOFT) 100 MG tablet Take 100 mg by mouth daily.   Yes [provider]  amoxicillin (AMOXIL) 400 MG/5ML suspension 10 ml po bid 05/26/20   Elson Areas, PA-C  nitrofurantoin, macrocrystal-monohydrate, (MACROBID) 100 MG capsule Take 1  capsule (100 mg total) by mouth 2 (two) times daily. X 5 days 12/15/19   Domenick Gong, MD  phenazopyridine (PYRIDIUM) 200 MG tablet Take 1 tablet (200 mg total) by mouth 3 (three) times daily as needed for pain. 12/15/19   Domenick Gong, MD  tiZANidine (ZANAFLEX) 2 MG tablet Take 2 mg by mouth every 6 (six) hours as needed for muscle spasms.    [provider]    Family History Family History  Problem Relation Age of Onset  . Stroke Mother   . Cancer Paternal Grandfather   . Hypertension Father     Social History Social History   Tobacco Use  . Smoking status: Current Every Day Smoker    Packs/day: 0.50    Years: 10.00    Pack years: 5.00    Types: Cigarettes  . Smokeless tobacco: Never Used  Vaping Use  . Vaping Use: Never used  Substance Use Topics  . Alcohol use: No  . Drug use: No       Allergies   Cephalexin, Sulfa antibiotics, and Sulfur   Review of Systems Review of Systems  HENT: Positive for ear pain.   Musculoskeletal: Positive for neck pain.  Neurological: Positive for headaches.  All other systems reviewed and are negative.    Physical Exam Triage Vital Signs ED Triage Vitals  Enc Vitals Group     BP 05/26/20 1559 113/71     Pulse Rate 05/26/20 1559 (!) 102     Resp 05/26/20 1559 14     Temp 05/26/20 1559 98.3 F (36.8 C)     Temp Source 05/26/20 1559 Oral     SpO2 05/26/20 1559 99 %     Weight 05/26/20 1557 160 lb (72.6 kg)     Height 05/26/20 1557 5\' 4"  (1.626 m)     Head Circumference --      Peak Flow --      Pain Score 05/26/20 1557 4     Pain Loc --      Pain Edu? --      Excl. in GC? --    No data found.  Updated Vital Signs BP 113/71 (BP Location: Right Arm)   Pulse (!) 102   Temp 98.3 F (36.8 C) (Oral)   Resp 14   Ht 5\' 4"  (1.626 m)   Wt 72.6 kg   LMP 05/09/2020 (Approximate)   SpO2 99%   BMI 27.46 kg/m   Visual Acuity Right Eye Distance:   Left Eye Distance:   Bilateral Distance:    Right Eye Near:   Left Eye Near:    Bilateral Near:     Physical Exam Vitals and nursing note reviewed.  Constitutional:      Appearance: She is well-developed.  HENT:     Head: Normocephalic.     Mouth/Throat:     Mouth: Mucous membranes are moist.  Eyes:     Extraocular Movements: Extraocular movements intact.  Cardiovascular:     Rate and Rhythm: Normal rate.     Heart sounds: Normal heart sounds.  Pulmonary:     Effort: Pulmonary effort is normal.  Abdominal:     General: There is no distension.  Musculoskeletal:        General: Normal range of motion.     Cervical back: Normal range of motion.  Neurological:     Mental Status: She is alert and oriented to person, place, and time.  Psychiatric:        Mood  and Affect: Mood normal.      UC Treatments / Results  Labs (all labs ordered are listed, but only  abnormal results are displayed) Labs Reviewed - No data to display  EKG   Radiology No results found.  Procedures Procedures (including critical care time)  Medications Ordered in UC Medications - No data to display  Initial Impression / Assessment and Plan / UC Course  I have reviewed the triage vital signs and the nursing notes.  Pertinent labs & imaging results that were available during my care of the patient were reviewed by me and considered in my medical decision making (see chart for details).     MDM:  Pt has poor landmarks right tm.  Right jaw tender to palpation.  I will try treating pt with amoxicillian.  Pt advised to follow up with her MD for recheck  Final Clinical Impressions(s) / UC Diagnoses   Final diagnoses:  Right ear pain  Acute otitis media, unspecified otitis media type  Arthralgia of right temporomandibular joint   Discharge Instructions   None    ED Prescriptions    Medication Sig Dispense Auth. Provider   amoxicillin (AMOXIL) 400 MG/5ML suspension 10 ml po bid 200 mL Fransico Meadow, [place]     PDMP not reviewed this encounter.  An After Visit Summary was printed and given to the patient.    Fransico Meadow, [place] 05/28/20 1656

## 2020-09-24 ENCOUNTER — Other Ambulatory Visit: Payer: Self-pay

## 2020-09-24 ENCOUNTER — Ambulatory Visit
Admission: RE | Admit: 2020-09-24 | Discharge: 2020-09-24 | Disposition: A | Discharge status: Home / Self Care | Payer: Medicaid Other | Source: Ambulatory Visit | Attending: Physician Assistant | Admitting: Physician Assistant

## 2020-09-24 VITALS — BP 111/79 | HR 87 | Temp 98.1°F | Resp 14 | Ht 64.0 in | Wt 160.0 lb

## 2020-09-24 DIAGNOSIS — R300 Dysuria: Secondary | ICD-10-CM

## 2020-09-24 LAB — URINALYSIS, COMPLETE (UACMP) WITH MICROSCOPIC
RBC / HPF: NONE SEEN RBC/hpf (ref 0–5)
Specific Gravity, Urine: <1.005 — ABNORMAL LOW (ref 1.005–1.030)
WBC, UA: >50 WBC/hpf (ref 0–5)
pH: 5 (ref 5.0–8.0)

## 2020-09-24 MED ORDER — NITROFURANTOIN MONOHYD MACRO 100 MG PO CAPS: 100.0000 mg | ORAL_CAPSULE | Freq: Two times a day (BID) | ORAL | 0 refills | Status: AC

## 2020-09-24 NOTE — ED Triage Notes (Signed)
Patient c/o burning when urinating and urinary frequency that started on Friday.  Patient denies fevers.

## 2020-09-24 NOTE — Discharge Instructions (Addendum)

## 2020-09-24 NOTE — ED Provider Notes (Signed)
[facility]    CSN: [id number] Arrival date & time: 09/24/20  0851      History   Chief Complaint Chief Complaint  Patient presents with   Appointment   Dysuria    HPI Katelyn Jefferson is a 34 y.o. female presenting for 2-day history of dysuria and urinary frequency.  Patient says that she gets frequent UTIs and she believes she has a UTI again.  Also admits to some suprapubic pressure.  She denies any fever, fatigue, back pain, vaginal discharge or concern for STIs.  Patient is sexually active with her husband only.  Patient states she believes that she got a UTI from not cleaning herself properly after intercourse.  Patient has been taking over-the-counter Azo.  No other complaints or concerns.  HPI  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    Patient Active Problem List   Diagnosis Date Noted   Calculus of gallbladder without cholecystitis without obstruction 12/31/2018    Past Surgical History:  Procedure Laterality Date   CESAREAN SECTION N/A 02/28/2016   Procedure: CESAREAN SECTION;  Surgeon: Suzy Bouchardhomas J Schermerhorn, MD;  Location: [facility];  Service: Obstetrics;  Laterality: N/A;   CESAREAN SECTION  2010   CHOLECYSTECTOMY N/A 01/15/2019   Procedure: LAPAROSCOPIC CHOLECYSTECTOMY;  Surgeon: Ancil Linseyavis, Jason Evan, MD;  Location: [facility];  Service: General;  Laterality: N/A;   COLONOSCOPY  34 years old   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   Procedure: EXCISION OF ABDOMINAL WALL MASS;  Surgeon: Kieth BrightlySankar, Seeplaputhur G, MD;  Location: [facility];  Service: General;  Laterality: N/A;    TUBAL LIGATION Bilateral 02/28/2016   Procedure: BILATERAL TUBAL LIGATION;  Surgeon: Suzy Bouchardhomas J Schermerhorn, MD;  Location: [facility];  Service: Obstetrics;  Laterality: Bilateral;   WISDOM TOOTH EXTRACTION      OB History    Gravida  3   Para  2   Term  1   Preterm  1   AB  1   Living  2     SAB  1   TAB      Ectopic      Multiple      Live Births  2        Obstetric Comments  1st Menstrual Cycle:   15 1st Pregnancy:  22          Home Medications    Prior to Admission medications   Medication Sig Start Date End Date Taking? Authorizing Provider  Melatonin 10 MG CAPS Take by mouth.   Yes [provider]  sertraline (ZOLOFT) 100 MG tablet Take 100 mg by mouth daily.   Yes [provider]  acetaminophen (TYLENOL) 500 MG tablet Take 1,000 mg by mouth every 8 (eight) hours as needed.    [provider]  clonazePAM (KLONOPIN) 0.5 MG tablet Take 0.5 mg by mouth 2 (two) times daily as needed. 06/10/20   [provider]  nitrofurantoin, macrocrystal-monohydrate, (MACROBID) 100 MG capsule Take 1 capsule (100 mg total) by mouth 2 (two) times daily for 5 days. 09/24/20 09/29/20  Shirlee Latch, PA-C  phenazopyridine (PYRIDIUM) 200 MG tablet Take 1 tablet (200 mg total) by mouth 3 (three) times daily as needed for pain. 12/15/19   Domenick Gong, MD  tiZANidine (ZANAFLEX) 2 MG tablet Take 2 mg by mouth every 6 (six) hours as needed for muscle spasms.    [provider]    Family History Family History  Problem Relation Age of Onset   Stroke Mother    Cancer Paternal Grandfather    Hypertension Father     Social History Social History   Tobacco Use   Smoking status: Current Every Day Smoker    Packs/day: 0.50    Years: 10.00    Pack years: 5.00    Types: Cigarettes   Smokeless tobacco: Never Used  Building services engineer Use: Never used  Substance Use Topics   Alcohol use: No   Drug use: No     Allergies    Cephalexin, Sulfa antibiotics, and Sulfur   Review of Systems Review of Systems  Constitutional: Negative for chills, fatigue and fever.  Gastrointestinal: Positive for abdominal pain. Negative for diarrhea, nausea and vomiting.  Genitourinary: Positive for dysuria, frequency and urgency. Negative for decreased urine volume, flank pain, hematuria, pelvic pain, vaginal bleeding, vaginal discharge and vaginal pain.  Musculoskeletal: Negative for back pain.  Skin: Negative for rash.     Physical Exam Triage Vital Signs ED Triage Vitals  Enc Vitals Group     BP 09/24/20 0907 111/79     Pulse Rate 09/24/20 0907 87     Resp 09/24/20 0907 14     Temp 09/24/20 0907 98.1 F (36.7 C)     Temp Source 09/24/20 0907 Oral     SpO2 09/24/20 0907 99 %     Weight 09/24/20 0904 160 lb (72.6 kg)     Height 09/24/20 0904 5\' 4"  (1.626 m)     Head Circumference --      Peak Flow --      Pain Score 09/24/20 0904 3     Pain Loc --      Pain Edu? --      Excl. in GC? --    No data found.  Updated Vital Signs BP 111/79 (BP Location: Left Arm)    Pulse 87    Temp 98.1 F (36.7 C) (Oral)    Resp 14    Ht 5\' 4"  (1.626 m)    Wt 160 lb (72.6 kg)    LMP 09/10/2020 (Approximate)    SpO2 99%    BMI 27.46 kg/m        Physical Exam Vitals and nursing note reviewed.  Constitutional:      General: She is not in acute distress.    Appearance: Normal appearance. She is not ill-appearing or toxic-appearing.  HENT:     Head: Normocephalic and atraumatic.  Eyes:     General: No scleral icterus.       Right eye: No discharge.        Left eye: No discharge.     Conjunctiva/sclera: Conjunctivae normal.  Cardiovascular:     Rate and Rhythm: Normal rate and regular rhythm.     Heart sounds: Normal heart sounds.  Pulmonary:     Effort: Pulmonary effort is normal. No respiratory distress.     Breath sounds: Normal breath sounds.  Abdominal:     Palpations: Abdomen is soft.     Tenderness: There is  abdominal tenderness (suprapubic). There is no  right CVA tenderness or left CVA tenderness.  Musculoskeletal:     Cervical back: Neck supple.  Skin:    General: Skin is dry.  Neurological:     General: No focal deficit present.     Mental Status: She is alert. Mental status is at baseline.     Motor: No weakness.     Gait: Gait normal.  Psychiatric:        Mood and Affect: Mood normal.        Behavior: Behavior normal.        Thought Content: Thought content normal.      UC Treatments / Results  Labs (all labs ordered are listed, but only abnormal results are displayed) Labs Reviewed  URINALYSIS, COMPLETE (UACMP) WITH MICROSCOPIC - Abnormal; Notable for the following components:      Result Value   Color, Urine ORANGE (*)    APPearance CLOUDY (*)    Specific Gravity, Urine <1.005 (*)    Glucose, UA   (*)    Value: TEST NOT REPORTED DUE TO COLOR INTERFERENCE OF URINE PIGMENT   Hgb urine dipstick   (*)    Value: TEST NOT REPORTED DUE TO COLOR INTERFERENCE OF URINE PIGMENT   Bilirubin Urine   (*)    Value: TEST NOT REPORTED DUE TO COLOR INTERFERENCE OF URINE PIGMENT   Ketones, ur   (*)    Value: TEST NOT REPORTED DUE TO COLOR INTERFERENCE OF URINE PIGMENT   Protein, ur   (*)    Value: TEST NOT REPORTED DUE TO COLOR INTERFERENCE OF URINE PIGMENT   Nitrite   (*)    Value: TEST NOT REPORTED DUE TO COLOR INTERFERENCE OF URINE PIGMENT   Leukocytes,Ua   (*)    Value: TEST NOT REPORTED DUE TO COLOR INTERFERENCE OF URINE PIGMENT   Non Squamous Epithelial PRESENT (*)    Bacteria, UA MANY (*)    All other components within normal limits  URINE CULTURE    EKG   Radiology No results found.  Procedures Procedures (including critical care time)  Medications Ordered in UC Medications - No data to display  Initial Impression / Assessment and Plan / UC Course  I have reviewed the triage vital signs and the nursing notes.  Pertinent labs & imaging results that were available  during my care of the patient were reviewed by me and considered in my medical decision making (see chart for details).   Urinalysis unable to be performed due to color interference from Azo.  Based on patient's symptoms and history, suspect UTI.  All vital signs are normal and stable and no CVA tenderness.  Patient not concerned for STIs and declines STI testing.  Treating with Macrobid at this time as she said that works best for her and she has multiple allergies.  Urine sent for culture and will contact patient with results in a couple of days.  ED precautions discussed.   Final Clinical Impressions(s) / UC Diagnoses   Final diagnoses:  Dysuria     Discharge Instructions     UTI: Based on either symptoms or urinalysis, you may have a urinary tract infection. We will send the urine for culture and call with results in a few days. Begin antibiotics at this time. Your symptoms should be much improved over the next 2-3 days. Increase rest and fluid intake. If for some reason symptoms are worsening or not improving after a couple of days or the urine culture determines the antibiotics you  are taking will not treat the infection, the antibiotics may be changed. Return or go to ER for fever, back pain, worsening urinary pain, discharge, increased blood in urine. May take Tylenol or Motrin OTC for pain relief or consider AZO if no contraindications     ED Prescriptions    Medication Sig Dispense Auth. Provider   nitrofurantoin, macrocrystal-monohydrate, (MACROBID) 100 MG capsule Take 1 capsule (100 mg total) by mouth 2 (two) times daily for 5 days. 10 capsule Shirlee Latch, PA-C     PDMP not reviewed this encounter.   Shirlee Latch, PA-C 09/24/20 [phone]

## 2020-09-27 LAB — URINE CULTURE: Culture: >=100000 — AB

## 2021-02-03 ENCOUNTER — Ambulatory Visit: Payer: Self-pay

## 2021-02-03 ENCOUNTER — Other Ambulatory Visit: Payer: Self-pay

## 2021-02-03 ENCOUNTER — Encounter: Payer: Self-pay | Admitting: Emergency Medicine

## 2021-02-03 ENCOUNTER — Ambulatory Visit
Admission: EM | Admit: 2021-02-03 | Discharge: 2021-02-03 | Disposition: A | Discharge status: Home / Self Care | Payer: Medicaid Other

## 2021-02-03 DIAGNOSIS — J0100 Acute maxillary sinusitis, unspecified: Secondary | ICD-10-CM | POA: Diagnosis not present

## 2021-02-03 MED ORDER — GUAIFENESIN 100 MG/5ML PO LIQD: 100.0000 mg | ORAL | 0 refills | Status: DC | PRN

## 2021-02-03 MED ORDER — FLUTICASONE PROPIONATE 50 MCG/ACT NA SUSP: 1.0000 | Freq: Every day | NASAL | 2 refills | Status: DC

## 2021-02-03 MED ORDER — AMOXICILLIN-POT CLAVULANATE 400-57 MG/5ML PO SUSR: 875.0000 mg | Freq: Two times a day (BID) | ORAL | 0 refills | Status: AC

## 2021-02-03 NOTE — Discharge Instructions (Signed)
Tylenol and/or ibuprofen as needed for pain or fevers.  Push fluids to ensure adequate hydration and keep secretions thin.  Complete course of antibiotics.  Daily flonase.  Mucinex as an expectorant.  If symptoms worsen or do not improve in the next week to return to be seen or to follow up with your PCP.

## 2021-02-03 NOTE — ED Provider Notes (Signed)
[facility]    CSN: [id number] Arrival date & time: 02/03/21  1147      History   Chief Complaint Chief Complaint  Patient presents with  . Sinus Problem  . Nasal Congestion    HPI Katelyn Jefferson is a 35 y.o. female.   Katelyn Jefferson presents with complaints of worsening of facial sinus pressure and sore throat. Started 2.5 weeks ago. Had started to improve but then worsened again. No cough. No shortness of breath . No known fevers. No ear pain. Has been taking tylenol which hasn't helped.    ROS per HPI, negative if not otherwise mentioned.      Past Medical History:  Diagnosis Date  . Abdominal mass   . Anxiety   . Anxiety   . Calculus of gallbladder without cholecystitis without obstruction 12/31/2018  . Decreased fetal movement 10/27/2015  . Depression   . Dysrhythmia    tachycardia  . Heart murmur    Congential heart murmur  No longer present  . History of poor fetal growth 08/31/2015  . Miscarriage   . 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 Calculus of gallbladder without cholecystitis without obstruction 12/31/2018    Past Surgical History:  Procedure Laterality Date  . CESAREAN SECTION N/A 02/28/2016   Procedure: CESAREAN SECTION;  Surgeon: Suzy Bouchard, MD;  Location: [facility];  Service: Obstetrics;  Laterality: N/A;  . CESAREAN SECTION  2010  . CHOLECYSTECTOMY N/A 01/15/2019   Procedure: LAPAROSCOPIC CHOLECYSTECTOMY;  Surgeon: Ancil Linsey, MD;  Location: [facility];  Service: General;  Laterality: N/A;  . COLONOSCOPY  35 years old  . 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 TUBAL LIGATION Bilateral 02/28/2016   Procedure: BILATERAL TUBAL LIGATION;  Surgeon: Suzy Bouchard, MD;  Location: [facility];  Service: Obstetrics;   Laterality: Bilateral;  . WISDOM TOOTH EXTRACTION      OB History    Gravida  3   Para  2   Term  1   Preterm  1   AB  1   Living  2     SAB  1   IAB      Ectopic      Multiple      Live Births  2        Obstetric Comments  1st Menstrual Cycle:   15 1st Pregnancy:  22          Home Medications    Prior to Admission medications   Medication Sig Start Date End Date Taking? Authorizing Provider  amoxicillin-clavulanate (AUGMENTIN) 400-57 MG/5ML suspension Take 10.9 mLs (875 mg total) by mouth 2 (two) times daily for 7 days. 02/03/21 02/10/21 Yes Aasha Dina, Barron Alvine, NP  fluticasone (FLONASE) 50 MCG/ACT nasal spray Place 1 spray into both nostrils daily. 02/03/21  Yes Jaziel Bennett, Dorene Grebe B, NP  guaiFENesin (ROBITUSSIN) 100 MG/5ML liquid Take 5-10 mLs (100-200 mg total) by mouth every 4 (four) hours as needed for cough. 02/03/21  Yes Linus Mako B, NP  triamcinolone (NASACORT) 55 MCG/ACT AERO nasal inhaler Place into the nose. 06/29/20 06/29/21 Yes [provider]  acetaminophen (TYLENOL) 500 MG tablet Take 1,000 mg by mouth every 8 (eight) hours as needed.    [provider]  clonazePAM Scarlette Calico) 0.5  MG tablet Take 0.5 mg by mouth 2 (two) times daily as needed. 06/10/20   [provider]  Melatonin 10 MG CAPS Take by mouth.    [provider]  phenazopyridine (PYRIDIUM) 200 MG tablet Take 1 tablet (200 mg total) by mouth 3 (three) times daily as needed for pain. 12/15/19   Domenick Gong, MD  PROAIR HFA 108 [phone] Base) MCG/ACT inhaler Inhale 2 puffs into the lungs every 6 (six) hours as needed. 12/08/20   [provider]  sertraline (ZOLOFT) 100 MG tablet Take 100 mg by mouth daily.    [provider]  tiZANidine (ZANAFLEX) 2 MG tablet Take 2 mg by mouth every 6 (six) hours as needed for muscle spasms.    [provider]    Family History Family History  Problem Relation Age of Onset  . Stroke Mother   . Cancer  Paternal Grandfather   . Hypertension Father     Social History Social History   Tobacco Use  . Smoking status: Current Every Day Smoker    Packs/day: 0.50    Years: 10.00    Pack years: 5.00    Types: Cigarettes  . Smokeless tobacco: Never Used  Vaping Use  . Vaping Use: Never used  Substance Use Topics  . Alcohol use: No  . Drug use: No     Allergies   Cephalexin, Elemental sulfur, and Sulfa antibiotics   Review of Systems Review of Systems   Physical Exam Triage Vital Signs ED Triage Vitals  Enc Vitals Group     BP 02/03/21 1158 107/79     Pulse Rate 02/03/21 1158 83     Resp 02/03/21 1158 14     Temp 02/03/21 1158 97.8 F (36.6 C)     Temp Source 02/03/21 1158 Oral     SpO2 02/03/21 1158 99 %     Weight 02/03/21 1155 155 lb (70.3 kg)     Height 02/03/21 1155 5\' 4"  (1.626 m)     Head Circumference --      Peak Flow --      Pain Score 02/03/21 1154 2     Pain Loc --      Pain Edu? --      Excl. in GC? --    No data found.  Updated Vital Signs BP 107/79 (BP Location: Right Arm)   Pulse 83   Temp 97.8 F (36.6 C) (Oral)   Resp 14   Ht 5\' 4"  (1.626 m)   Wt 155 lb (70.3 kg)   LMP 01/20/2021 (Approximate)   SpO2 99%   BMI 26.61 kg/m   Visual Acuity Right Eye Distance:   Left Eye Distance:   Bilateral Distance:    Right Eye Near:   Left Eye Near:    Bilateral Near:     Physical Exam Constitutional:      General: She is not in acute distress.    Appearance: She is well-developed.  HENT:     Head: Normocephalic and atraumatic.     Right Ear: Tympanic membrane and ear canal normal.     Left Ear: Tympanic membrane and ear canal normal.     Nose:     Right Sinus: Maxillary sinus tenderness present.     Left Sinus: Maxillary sinus tenderness present.     Mouth/Throat:     Mouth: Mucous membranes are moist.     Pharynx: No oropharyngeal exudate.  Eyes:     Pupils: Pupils are equal, round,  and reactive to light.  Cardiovascular:     Rate  and Rhythm: Normal rate.  Pulmonary:     Effort: Pulmonary effort is normal.  Skin:    General: Skin is warm and dry.  Neurological:     Mental Status: She is alert and oriented to person, place, and time.      UC Treatments / Results  Labs (all labs ordered are listed, but only abnormal results are displayed) Labs Reviewed - No data to display  EKG   Radiology No results found.  Procedures Procedures (including critical care time)  Medications Ordered in UC Medications - No data to display  Initial Impression / Assessment and Plan / UC Course  I have reviewed the triage vital signs and the nursing notes.  Pertinent labs & imaging results that were available during my care of the patient were reviewed by me and considered in my medical decision making (see chart for details).     worsening of sinus symptoms which started >2 weeks ago. Supportive cares and augmentin provided. (cephlex noted to cause diarrhea, has previously received augmentin). Requests liquid medications. Follow up recommendations provided. Patient verbalized understanding and agreeable to plan.   Final Clinical Impressions(s) / UC Diagnoses   Final diagnoses:  Acute maxillary sinusitis, recurrence not specified     Discharge Instructions     Tylenol and/or ibuprofen as needed for pain or fevers.  Push fluids to ensure adequate hydration and keep secretions thin.  Complete course of antibiotics.  Daily flonase.  Mucinex as an expectorant.  If symptoms worsen or do not improve in the next week to return to be seen or to follow up with your PCP.     ED Prescriptions    Medication Sig Dispense Auth. Provider   amoxicillin-clavulanate (AUGMENTIN) 400-57 MG/5ML suspension Take 10.9 mLs (875 mg total) by mouth 2 (two) times daily for 7 days. 160 mL Linus Mako B, NP   fluticasone (FLONASE) 50 MCG/ACT nasal spray Place 1 spray into both nostrils daily. 16 g Linus Mako B, NP   guaiFENesin  (ROBITUSSIN) 100 MG/5ML liquid Take 5-10 mLs (100-200 mg total) by mouth every 4 (four) hours as needed for cough. 60 mL Linus Mako B, NP     PDMP not reviewed this encounter.   Georgetta Haber, NP 02/03/21 1215

## 2021-02-03 NOTE — ED Triage Notes (Signed)
Patient c/o runny nose, nasal congestion, and sinus congestion and pressure that started 2 weeks ago.  Patient denies fevers.

## 2021-05-27 ENCOUNTER — Other Ambulatory Visit: Payer: Self-pay

## 2021-05-27 ENCOUNTER — Ambulatory Visit
Admission: RE | Admit: 2021-05-27 | Discharge: 2021-05-27 | Disposition: A | Discharge status: Home / Self Care | Payer: Medicaid Other | Source: Ambulatory Visit | Attending: Student | Admitting: Student

## 2021-05-27 VITALS — BP 107/72 | HR 70 | Temp 98.0°F | Resp 14 | Ht 64.0 in | Wt 140.0 lb

## 2021-05-27 DIAGNOSIS — J019 Acute sinusitis, unspecified: Secondary | ICD-10-CM

## 2021-05-27 DIAGNOSIS — U071 COVID-19: Secondary | ICD-10-CM

## 2021-05-27 MED ORDER — METHYLPREDNISOLONE 4 MG PO TBPK: ORAL_TABLET | ORAL | 0 refills | Status: DC

## 2021-05-27 MED ORDER — FLUTICASONE PROPIONATE 50 MCG/ACT NA SUSP: 2.0000 | Freq: Every day | NASAL | 2 refills | Status: DC

## 2021-05-27 NOTE — Discharge Instructions (Signed)
-  Take medication as prescribed. -Would recommend daily Zyrtec at this time. -Follow-up with primary care physician if symptoms fail to improve.

## 2021-05-27 NOTE — ED Triage Notes (Signed)
Patient states that she was diagnosed with COVID about 9 days ago.  Patient reports ongoing cough and chest congestion.  Patient states that she is now also having sinus congestion and pressure.  Patient denies recent fevers.

## 2021-05-27 NOTE — ED Provider Notes (Signed)
[facility]    CSN: [id number] Arrival date & time: 05/27/21  0959      History   Chief Complaint Chief Complaint  Patient presents with  . Cough    COVID+    HPI Katelyn Jefferson is a 35 y.o. female who presents today for evaluation of increased chest tightness and congestion.  The patient was recently diagnosed with COVID, the patient was prescribed antiviral medication which she has not begun to take due to difficulty taking pills.  The patient was also given a prescription for inhaler which she has been using routinely.  The patient reports increased chest tightness, denies any significant sputum production.  She does report nasal drainage.  Overall the patient feels that she has improved over the past 24 hours but is still experiencing upper respiratory symptoms.  Denies any shortness of breath or chest pain.  Denies any nausea or vomiting.  Denies any diarrhea.   Cough Associated symptoms: chills, rhinorrhea and sore throat   Associated symptoms: no fever    Past Medical History:  Diagnosis Date  . Abdominal mass   . Anxiety   . Anxiety   . Calculus of gallbladder without cholecystitis without obstruction 12/31/2018  . Decreased fetal movement 10/27/2015  . Depression   . Dysrhythmia    tachycardia  . Heart murmur    Congential heart murmur  No longer present  . History of poor fetal growth 08/31/2015  . Miscarriage   . 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 Calculus of gallbladder without cholecystitis without obstruction 12/31/2018    Past Surgical History:  Procedure Laterality Date  . CESAREAN SECTION N/A 02/28/2016   Procedure: CESAREAN SECTION;  Surgeon: Suzy Bouchard, MD;  Location: [facility];  Service: Obstetrics;  Laterality: N/A;  . CESAREAN SECTION  2010  . CHOLECYSTECTOMY N/A 01/15/2019   Procedure: LAPAROSCOPIC CHOLECYSTECTOMY;  Surgeon: Ancil Linsey, MD;  Location: [facility];   Service: General;  Laterality: N/A;  . COLONOSCOPY  35 years old  . 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 TUBAL LIGATION Bilateral 02/28/2016   Procedure: BILATERAL TUBAL LIGATION;  Surgeon: Suzy Bouchard, MD;  Location: [facility];  Service: Obstetrics;  Laterality: Bilateral;  . WISDOM TOOTH EXTRACTION      OB History     Gravida  3   Para  2   Term  1   Preterm  1   AB  1   Living  2      SAB  1   IAB      Ectopic      Multiple      Live Births  2        Obstetric Comments  1st Menstrual Cycle:   15 1st Pregnancy:  22           Home Medications    Prior to Admission medications   Medication Sig Start Date End Date Taking? Authorizing Provider  acetaminophen (TYLENOL) 500 MG tablet Take 1,000 mg by mouth every 8 (eight) hours as needed.   Yes [provider]  fluticasone (FLONASE) 50 MCG/ACT nasal spray Place 2 sprays into both nostrils daily. 05/27/21  Yes Anson Oregon, PA-C  methylPREDNISolone (MEDROL DOSEPAK) 4 MG TBPK tablet Take per package instructions  05/27/21  Yes Anson Oregon, PA-C  PROAIR HFA 108 [phone] Base) MCG/ACT inhaler Inhale 2 puffs into the lungs every 6 (six) hours as needed. 12/08/20  Yes [provider]  sertraline (ZOLOFT) 100 MG tablet Take 100 mg by mouth daily.   Yes [provider]  clonazePAM (KLONOPIN) 0.5 MG tablet Take 0.5 mg by mouth 2 (two) times daily as needed. 06/10/20   [provider]  guaiFENesin (ROBITUSSIN) 100 MG/5ML liquid Take 5-10 mLs (100-200 mg total) by mouth every 4 (four) hours as needed for cough. 02/03/21   Georgetta Haber, NP  Melatonin 10 MG CAPS Take by mouth.    [provider]  phenazopyridine (PYRIDIUM) 200 MG tablet Take 1 tablet (200 mg total) by mouth 3 (three) times daily as needed for  pain. 12/15/19   Domenick Gong, MD    Family History Family History  Problem Relation Age of Onset  . Stroke Mother   . Cancer Paternal Grandfather   . Hypertension Father     Social History Social History   Tobacco Use  . Smoking status: Every Day    Packs/day: 0.50    Years: 10.00    Pack years: 5.00    Types: Cigarettes  . Smokeless tobacco: Never  Vaping Use  . Vaping Use: Never used  Substance Use Topics  . Alcohol use: No  . Drug use: No     Allergies   Cephalexin, Elemental sulfur, and Sulfa antibiotics   Review of Systems Review of Systems  Constitutional:  Positive for chills. Negative for fever.  HENT:  Positive for rhinorrhea, sinus pressure and sore throat.   Eyes: Negative.   Respiratory:  Positive for cough.   Cardiovascular: Negative.   Gastrointestinal:  Negative for nausea and vomiting.  All other systems reviewed and are negative.   Physical Exam Triage Vital Signs ED Triage Vitals  Enc Vitals Group     BP 05/27/21 1031 107/72     Pulse Rate 05/27/21 1031 70     Resp 05/27/21 1031 14     Temp 05/27/21 1031 98 F (36.7 C)     Temp Source 05/27/21 1031 Oral     SpO2 05/27/21 1031 99 %     Weight 05/27/21 1027 140 lb (63.5 kg)     Height 05/27/21 1027 5\' 4"  (1.626 m)     Head Circumference --      Peak Flow --      Pain Score 05/27/21 1027 2     Pain Loc --      Pain Edu? --      Excl. in GC? --    No data found.  Updated Vital Signs BP 107/72 (BP Location: Left Arm)   Pulse 70   Temp 98 F (36.7 C) (Oral)   Resp 14   Ht 5\' 4"  (1.626 m)   Wt 140 lb (63.5 kg)   LMP 05/13/2021 (Approximate)   SpO2 99%   BMI 24.03 kg/m   Visual Acuity Right Eye Distance:   Left Eye Distance:   Bilateral Distance:    Right Eye Near:   Left Eye Near:    Bilateral Near:     Physical Exam Constitutional:      Appearance: She is not ill-appearing, toxic-appearing or diaphoretic.  HENT:     Right Ear: Tympanic membrane and ear canal  normal. There is no impacted cerumen.     Left Ear: Tympanic membrane and ear canal normal. There is no  impacted cerumen.     Nose:     Comments: Mild erythema to bilateral nasal canals.    Mouth/Throat:     Mouth: Mucous membranes are moist.     Pharynx: No oropharyngeal exudate or posterior oropharyngeal erythema.  Eyes:     Extraocular Movements: Extraocular movements intact.     Pupils: Pupils are equal, round, and reactive to light.  Cardiovascular:     Rate and Rhythm: Normal rate and regular rhythm.     Heart sounds: Normal heart sounds. No murmur heard.   No friction rub. No gallop.  Pulmonary:     Effort: No respiratory distress.     Breath sounds: No stridor. No rhonchi or rales.     Comments: At most minimal wheeze to bilateral upper lung fields. Abdominal:     General: Abdomen is flat. Bowel sounds are normal.     Palpations: Abdomen is soft.  Lymphadenopathy:     Cervical: No cervical adenopathy.  Neurological:     Mental Status: She is alert.   UC Treatments / Results  Labs (all labs ordered are listed, but only abnormal results are displayed) Labs Reviewed - No data to display  EKG   Radiology No results found.  Procedures Procedures (including critical care time)  Medications Ordered in UC Medications - No data to display  Initial Impression / Assessment and Plan / UC Course  I have reviewed the triage vital signs and the nursing notes.  Pertinent labs & imaging results that were available during my care of the patient were reviewed by me and considered in my medical decision making (see chart for details).     Treatment options were discussed today with the patient. 2.  I believe that the patient's symptoms are likely related to her recent COVID diagnosis 3.  The patient was given a prescription for Flonase and medrol dose pack. 4.  Follow-up with PCP if symptoms fail to improve. Final Clinical Impressions(s) / UC Diagnoses   Final diagnoses:   COVID  Acute non-recurrent sinusitis, unspecified location     Discharge Instructions      -Take medication as prescribed. -Would recommend daily Zyrtec at this time. -Follow-up with primary care physician if symptoms fail to improve.   ED Prescriptions     Medication Sig Dispense Auth. Provider   fluticasone (FLONASE) 50 MCG/ACT nasal spray Place 2 sprays into both nostrils daily. 16 g Anson Oregon, PA-C   methylPREDNISolone (MEDROL DOSEPAK) 4 MG TBPK tablet Take per package instructions 21 tablet Anson Oregon, PA-C      PDMP not reviewed this encounter.   Anson Oregon, PA-C 05/27/21 1735

## 2021-08-25 ENCOUNTER — Other Ambulatory Visit: Payer: Self-pay

## 2021-08-25 ENCOUNTER — Ambulatory Visit
Admission: RE | Admit: 2021-08-25 | Discharge: 2021-08-25 | Disposition: A | Discharge status: Home / Self Care | Payer: Medicaid Other | Source: Ambulatory Visit | Attending: Physician Assistant | Admitting: Physician Assistant

## 2021-08-25 VITALS — BP 106/70 | HR 79 | Temp 98.4°F | Resp 14 | Ht 64.0 in | Wt 140.0 lb

## 2021-08-25 DIAGNOSIS — N3001 Acute cystitis with hematuria: Secondary | ICD-10-CM | POA: Diagnosis not present

## 2021-08-25 LAB — POCT URINALYSIS DIP (DEVICE)
Glucose, UA: 250 mg/dL — AB
Ketones, ur: 15 mg/dL — AB
Nitrite: POSITIVE — AB
Protein, ur: >=300 mg/dL — AB
Specific Gravity, Urine: 1.02 (ref 1.005–1.030)
Urobilinogen, UA: >=8 mg/dL (ref 0.0–1.0)
pH: 6.5 (ref 5.0–8.0)

## 2021-08-25 LAB — GLUCOSE, CAPILLARY: Glucose-Capillary: 85 mg/dL (ref 70–99)

## 2021-08-25 MED ORDER — PHENAZOPYRIDINE HCL 200 MG PO TABS: 200.0000 mg | ORAL_TABLET | Freq: Three times a day (TID) | ORAL | 0 refills | Status: AC | PRN

## 2021-08-25 MED ORDER — NITROFURANTOIN MONOHYD MACRO 100 MG PO CAPS: 100.0000 mg | ORAL_CAPSULE | Freq: Two times a day (BID) | ORAL | 0 refills | Status: AC

## 2021-08-25 NOTE — ED Triage Notes (Addendum)
Patient c/o nausea, pain when urinating, and lower back pain that started 3 days ago.  Patient reports some blood in her urine.  Patient states that she has been taking OTC AZO.

## 2021-08-25 NOTE — ED Provider Notes (Signed)
[facility]    CSN: [id number] Arrival date & time: 08/25/21  1158      History   Chief Complaint Chief Complaint  Patient presents with   Dysuria   Back Pain    HPI Katelyn Jefferson is a 35 y.o. female.   HPI  Dysuria: Pt reports that for the past 3 days she has had dysuria, low back pain and urinary frequency. She has tried to stay hydrated with water and has used AZO for symptoms with some relief. She denies vomiting, abdominal pain, fever. No recent UTI. LMP: 2 weeks ago.   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    Patient Active Problem List   Diagnosis Date Noted   Calculus of gallbladder without cholecystitis without obstruction 12/31/2018    Past Surgical History:  Procedure Laterality Date   CESAREAN SECTION N/A 02/28/2016   Procedure: CESAREAN SECTION;  Surgeon: Suzy Bouchard, MD;  Location: [facility];  Service: Obstetrics;  Laterality: N/A;   CESAREAN SECTION  2010   CHOLECYSTECTOMY N/A 01/15/2019   Procedure: LAPAROSCOPIC CHOLECYSTECTOMY;  Surgeon: Ancil Linsey, MD;  Location: [facility];  Service: General;  Laterality: N/A;   COLONOSCOPY  35 years old   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   Procedure: EXCISION OF ABDOMINAL WALL MASS;  Surgeon: Kieth Brightly, MD;  Location: [facility];  Service: General;  Laterality: N/A;   TUBAL LIGATION Bilateral 02/28/2016   Procedure: BILATERAL TUBAL LIGATION;  Surgeon: Suzy Bouchard, MD;  Location: [facility];  Service: Obstetrics;  Laterality: Bilateral;   WISDOM TOOTH EXTRACTION      OB History     Gravida  3   Para   2   Term  1   Preterm  1   AB  1   Living  2      SAB  1   IAB      Ectopic      Multiple      Live Births  2        Obstetric Comments  1st Menstrual Cycle:   15 1st Pregnancy:  22           Home Medications    Prior to Admission medications   Medication Sig Start Date End Date Taking? Authorizing Provider  phenazopyridine (PYRIDIUM) 200 MG tablet Take 1 tablet (200 mg total) by mouth 3 (three) times daily as needed for pain. 12/15/19  Yes Domenick Gong, MD  PROAIR HFA 108 [phone] Base) MCG/ACT inhaler Inhale 2 puffs into the lungs every 6 (six) hours as needed. 12/08/20  Yes [provider]  sertraline (ZOLOFT) 100 MG tablet Take 100 mg by mouth daily.   Yes [provider]  acetaminophen (TYLENOL) 500 MG tablet Take 1,000 mg by mouth every 8 (eight) hours as needed.    [provider]  clonazePAM (KLONOPIN) 0.5 MG tablet Take 0.5 mg by mouth 2 (two) times daily as needed. 06/10/20   [provider]  fluticasone (FLONASE) 50 MCG/ACT nasal spray Place 2 sprays into both nostrils  daily. 05/27/21   Anson Oregon, PA-C  guaiFENesin (ROBITUSSIN) 100 MG/5ML liquid Take 5-10 mLs (100-200 mg total) by mouth every 4 (four) hours as needed for cough. 02/03/21   Georgetta Haber, NP  Melatonin 10 MG CAPS Take by mouth.    [provider]  methylPREDNISolone (MEDROL DOSEPAK) 4 MG TBPK tablet Take per package instructions 05/27/21   Anson Oregon, PA-C    Family History Family History  Problem Relation Age of Onset   Stroke Mother    Cancer Paternal Grandfather    Hypertension Father     Social History Social History   Tobacco Use   Smoking status: Every Day    Packs/day: 0.50    Years: 10.00    Pack years: 5.00    Types: Cigarettes   Smokeless tobacco: Never  Vaping Use   Vaping Use: Never used  Substance Use Topics   Alcohol use: No   Drug use: No     Allergies   Cephalexin, Elemental sulfur, and  Sulfa antibiotics   Review of Systems Review of Systems  As stated above in HPI Physical Exam Triage Vital Signs ED Triage Vitals  Enc Vitals Group     BP 08/25/21 1216 106/70     Pulse Rate 08/25/21 1216 79     Resp 08/25/21 1216 14     Temp 08/25/21 1216 98.4 F (36.9 C)     Temp Source 08/25/21 1216 Oral     SpO2 08/25/21 1216 99 %     Weight 08/25/21 1213 140 lb (63.5 kg)     Height 08/25/21 1213 5\' 4"  (1.626 m)     Head Circumference --      Peak Flow --      Pain Score 08/25/21 1213 3     Pain Loc --      Pain Edu? --      Excl. in GC? --    No data found.  Updated Vital Signs BP 106/70 (BP Location: Right Arm)   Pulse 79   Temp 98.4 F (36.9 C) (Oral)   Resp 14   Ht 5\' 4"  (1.626 m)   Wt 140 lb (63.5 kg)   LMP 08/11/2021 (Approximate)   SpO2 99%   BMI 24.03 kg/m   Physical Exam Vitals and nursing note reviewed.  Constitutional:      General: She is not in acute distress.    Appearance: Normal appearance. She is not ill-appearing, toxic-appearing or diaphoretic.  HENT:     Head: Normocephalic and atraumatic.  Cardiovascular:     Rate and Rhythm: Normal rate and regular rhythm.     Heart sounds: Normal heart sounds.  Pulmonary:     Effort: Pulmonary effort is normal.     Breath sounds: Normal breath sounds.  Abdominal:     General: Bowel sounds are normal. There is no distension.     Palpations: Abdomen is soft. There is no mass.     Tenderness: There is no abdominal tenderness. There is no right CVA tenderness, left CVA tenderness, guarding or rebound.     Hernia: No hernia is present.  Musculoskeletal:     Cervical back: Normal range of motion and neck supple.  Lymphadenopathy:     Cervical: No cervical adenopathy.  Skin:    General: Skin is warm.  Neurological:     Mental Status: She is alert and oriented to person, place, and time.     UC Treatments / Results  Labs (all  labs ordered are listed, but only abnormal results are  displayed) Labs Reviewed  POCT URINALYSIS DIP (DEVICE) - Abnormal; Notable for the following components:      Result Value   Glucose, UA 250 (*)    Bilirubin Urine MODERATE (*)    Ketones, ur 15 (*)    Hgb urine dipstick TRACE (*)    Protein, ur >=300 (*)    Nitrite POSITIVE (*)    Leukocytes,Ua LARGE (*)    All other components within normal limits  URINE CULTURE  POCT URINALYSIS DIPSTICK, ED / UC    EKG   Radiology No results found.  Procedures Procedures (including critical care time)  Medications Ordered in UC Medications - No data to display  Initial Impression / Assessment and Plan / UC Course  I have reviewed the triage vital signs and the nursing notes.  Pertinent labs & imaging results that were available during my care of the patient were reviewed by me and considered in my medical decision making (see chart for details).     New. Acute cystitis with hematuria. Treating with Macrobid given her allergies and symptoms to prevent further complications. Hydration with water encouraged. Follow up PRN.    Final Clinical Impressions(s) / UC Diagnoses   Final diagnoses:  None   Discharge Instructions   None    ED Prescriptions   None    PDMP not reviewed this encounter.   Rushie Chestnut, [place] 08/25/21 1249

## 2021-08-28 LAB — URINE CULTURE: Culture: >=100000 — AB

## 2022-10-09 ENCOUNTER — Ambulatory Visit: Payer: Self-pay

## 2022-12-20 ENCOUNTER — Encounter: Payer: Self-pay | Admitting: Emergency Medicine

## 2022-12-20 ENCOUNTER — Ambulatory Visit
Admission: EM | Admit: 2022-12-20 | Discharge: 2022-12-20 | Disposition: A | Discharge status: Home / Self Care | Payer: Medicaid Other | Attending: Emergency Medicine | Admitting: Emergency Medicine

## 2022-12-20 DIAGNOSIS — B3731 Acute candidiasis of vulva and vagina: Secondary | ICD-10-CM

## 2022-12-20 DIAGNOSIS — N760 Acute vaginitis: Secondary | ICD-10-CM

## 2022-12-20 DIAGNOSIS — B9689 Other specified bacterial agents as the cause of diseases classified elsewhere: Secondary | ICD-10-CM | POA: Diagnosis present

## 2022-12-20 LAB — URINALYSIS, MICROSCOPIC (REFLEX)

## 2022-12-20 LAB — WET PREP, GENITAL
Trich, Wet Prep: NONE SEEN
WBC, Wet Prep HPF POC: <10 — AB (ref <10)

## 2022-12-20 LAB — URINALYSIS, ROUTINE W REFLEX MICROSCOPIC
Bilirubin Urine: NEGATIVE
Glucose, UA: NEGATIVE mg/dL
Ketones, ur: NEGATIVE mg/dL
Leukocytes,Ua: NEGATIVE
Nitrite: NEGATIVE
Protein, ur: NEGATIVE mg/dL
Specific Gravity, Urine: <1.005 — ABNORMAL LOW (ref 1.005–1.030)
pH: 6 (ref 5.0–8.0)

## 2022-12-20 NOTE — ED Triage Notes (Signed)
Patient c/o lower abdominal and lower back pain that started around 2 pm today.  Patient reports history of ovarian cysts.  Patient denies N/V/D.  Patient states that she passes out after she was trying have a bowel movement.  Patient denies fevers or chills. Patient denies any urinary symptoms.

## 2022-12-20 NOTE — ED Provider Notes (Signed)
[facility]    CSN: [id number] Arrival date & time: 12/20/22  1514      History   Chief Complaint Chief Complaint  Patient presents with   Abdominal Pain   Back Pain   Near Syncope    HPI Katelyn Jefferson is a 37 y.o. female.   HPI  37 year old female here for evaluation of GI, GU, and GYN complaints.  The patient reports that she developed some significant lower abdominal cramping along with low back pain.  She felt she had to have a bowel movement so she went to the bathroom.  Her bowel movement was productive but it was not hard and she did not strain.  This helped with some of her discomfort but after she had her bowel movement she did have a fainting spell.  She states that she does drink a lot of water so she has urinary frequency but she denies any pain or urgency.  She also denies any blood in her stool or urine.  She has been experiencing a white vaginal discharge and states that she gets bacterial vaginosis after she and her boyfriend have intercourse.  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    Patient Active Problem List   Diagnosis Date Noted   Calculus of gallbladder without cholecystitis without obstruction 12/31/2018    Past Surgical History:  Procedure Laterality Date   CESAREAN SECTION N/A 02/28/2016   Procedure: CESAREAN SECTION;  Surgeon: Suzy Bouchard, MD;  Location: [facility];  Service: Obstetrics;  Laterality: N/A;   CESAREAN SECTION  2010   CHOLECYSTECTOMY N/A 01/15/2019   Procedure: LAPAROSCOPIC CHOLECYSTECTOMY;  Surgeon: Ancil Linsey, MD;  Location: [facility];  Service:  General;  Laterality: N/A;   COLONOSCOPY  37 years old   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   Procedure: EXCISION OF ABDOMINAL WALL MASS;  Surgeon: Kieth Brightly, MD;  Location: [facility];  Service: General;  Laterality: N/A;   TUBAL LIGATION Bilateral 02/28/2016   Procedure: BILATERAL TUBAL LIGATION;  Surgeon: Suzy Bouchard, MD;  Location: [facility];  Service: Obstetrics;  Laterality: Bilateral;   WISDOM TOOTH EXTRACTION      OB History     Gravida  3   Para  2   Term  1   Preterm  1   AB  1   Living  2      SAB  1   IAB      Ectopic      Multiple      Live Births  2        Obstetric Comments  1st Menstrual Cycle:   15 1st Pregnancy:  22           Home Medications    Prior to Admission medications   Medication Sig Start Date End Date Taking? Authorizing Provider  sertraline (ZOLOFT) 100 MG tablet Take 100 mg by mouth daily.   Yes [provider]  acetaminophen (TYLENOL) 500 MG tablet Take 1,000 mg by mouth every 8 (eight) hours as needed.  [provider]  clonazePAM (KLONOPIN) 0.5 MG tablet Take 0.5 mg by mouth 2 (two) times daily as needed. 06/10/20   [provider]  Melatonin 10 MG CAPS Take by mouth.    [provider]  nitrofurantoin, macrocrystal-monohydrate, (MACROBID) 100 MG capsule Take 1 capsule (100 mg total) by mouth 2 (two) times daily. 08/25/21   Rushie Chestnut, PA-C  phenazopyridine (PYRIDIUM) 200 MG tablet Take 1 tablet (200 mg total) by mouth 3 (three) times daily as needed for pain. 08/25/21   Rushie Chestnut, PA-C  PROAIR HFA 108 [phone] Base) MCG/ACT inhaler Inhale 2 puffs into the lungs every 6 (six) hours as needed. 12/08/20   [provider]    Family History Family History  Problem Relation Age of Onset   Stroke Mother    Cancer Paternal Grandfather    Hypertension Father     Social History Social History   Tobacco Use   Smoking status: Every Day     Packs/day: 0.50    Years: 10.00    Total pack years: 5.00    Types: Cigarettes   Smokeless tobacco: Never  Vaping Use   Vaping Use: Never used  Substance Use Topics   Alcohol use: No   Drug use: No     Allergies   Cephalexin, Elemental sulfur, and Sulfa antibiotics   Review of Systems Review of Systems  Constitutional:  Negative for fever.  Gastrointestinal:  Positive for abdominal pain. Negative for nausea and vomiting.  Genitourinary:  Positive for frequency and vaginal discharge. Negative for dysuria, hematuria and urgency.  Musculoskeletal:  Positive for back pain.  Neurological:  Positive for syncope.     Physical Exam Triage Vital Signs ED Triage Vitals  Enc Vitals Group     BP 12/20/22 1549 116/81     Pulse Rate 12/20/22 1549 72     Resp 12/20/22 1549 14     Temp 12/20/22 1549 98.2 F (36.8 C)     Temp Source 12/20/22 1549 Oral     SpO2 12/20/22 1549 100 %     Weight 12/20/22 1546 160 lb (72.6 kg)     Height 12/20/22 1546 5\' 4"  (1.626 m)     Head Circumference --      Peak Flow --      Pain Score 12/20/22 1546 3     Pain Loc --      Pain Edu? --      Excl. in GC? --    No data found.  Updated Vital Signs BP 116/81 (BP Location: Left Arm)   Pulse 72   Temp 98.2 F (36.8 C) (Oral)   Resp 14   Ht 5\' 4"  (1.626 m)   Wt 160 lb (72.6 kg)   LMP 11/26/2022   SpO2 100%   BMI 27.46 kg/m   Visual Acuity Right Eye Distance:   Left Eye Distance:   Bilateral Distance:    Right Eye Near:   Left Eye Near:    Bilateral Near:     Physical Exam Vitals and nursing note reviewed.  Constitutional:      Appearance: Normal appearance. She is not ill-appearing.  HENT:     Head: Normocephalic and atraumatic.  Cardiovascular:     Rate and Rhythm: Normal rate and regular rhythm.     Pulses: Normal pulses.     Heart sounds: Normal heart sounds. No murmur heard.    No friction rub. No gallop.  Pulmonary:     Effort:  Pulmonary effort is normal.      Breath sounds: Normal breath sounds. No wheezing, rhonchi or rales.  Abdominal:     General: Abdomen is flat. Bowel sounds are normal.     Palpations: Abdomen is soft.     Tenderness: There is no abdominal tenderness. There is no right CVA tenderness, left CVA tenderness, guarding or rebound.  Skin:    General: Skin is warm and dry.     Capillary Refill: Capillary refill takes less than 2 seconds.  Neurological:     General: No focal deficit present.     Mental Status: She is alert and oriented to person, place, and time.  Psychiatric:        Mood and Affect: Mood normal.        Behavior: Behavior normal.        Thought Content: Thought content normal.        Judgment: Judgment normal.      UC Treatments / Results  Labs (all labs ordered are listed, but only abnormal results are displayed) Labs Reviewed  WET PREP, GENITAL - Abnormal; Notable for the following components:      Result Value   Yeast Wet Prep HPF POC PRESENT (*)    Clue Cells Wet Prep HPF POC PRESENT (*)    WBC, Wet Prep HPF POC <10 (*)    All other components within normal limits  URINALYSIS, ROUTINE W REFLEX MICROSCOPIC - Abnormal; Notable for the following components:   Specific Gravity, Urine <1.005 (*)    Hgb urine dipstick TRACE (*)    All other components within normal limits  URINALYSIS, MICROSCOPIC (REFLEX) - Abnormal; Notable for the following components:   Bacteria, UA FEW (*)    All other components within normal limits    EKG   Radiology No results found.  Procedures Procedures (including critical care time)  Medications Ordered in UC Medications - No data to display  Initial Impression / Assessment and Plan / UC Course  I have reviewed the triage vital signs and the nursing notes.  Pertinent labs & imaging results that were available during my care of the patient were reviewed by me and considered in my medical decision making (see chart for details).   Patient is a nontoxic-appearing  37 year old female here for evaluation of lower back and lower abdominal pain as outlined in HPI above.  She states that the pain came on her all of a sudden and that she had the urge to have a bowel movement.  She did have a bowel movement but then had a syncopal episode afterwards.  She denies having hard stools or having to strain to have a bowel movement this afternoon.  She denies any blood in her urine or stool.  She does urinate frequently but states she also drinks a lot of water.  She has no dysuria or urgency.  She does have a white vaginal discharge with an odor and states that she believes she has BV.  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  She also has a history of ovarian cysts and a family history of uterine fibroids.  A urinalysis was collected at triage and is pending.  Urinalysis has a low specific gravity of <1.005 and trace hemoglobin.  Negative for leukocyte esterase, nitrates, or protein.  Reflex microscopy shows few bacteria.  Given patient's white vaginal discharge and history of recurrent BV I will order a vaginal wet prep.  Wet prep is positive for  clue cells and yeast.  Negative for trichomonas.  I will discharge patient home on 500 mg of metronidazole twice daily for 7 days.  I will treat her with Diflucan 150 mg once now and repeat in 7 days.  Final Clinical Impressions(s) / UC Diagnoses   Final diagnoses:  BV (bacterial vaginosis)  Vaginal yeast infection     Discharge Instructions      Take the Flagyl (metronidazole) 500 mg twice daily for treatment of your bacterial vaginosis.  Avoid alcohol while on the metronidazole as taken together will cause of vomiting.  Bacterial vaginosis is often caused by a imbalance of bacteria in your vaginal vault.  This is sometimes a result of using tampons or hormonal fluctuations during her menstrual cycle.  You if your symptoms are recurrent you can try using a boric acid suppository twice  weekly to help maintain the acid-base balance in your vagina vault which could prevent further infection.  You can also use these after intercourse with your boyfriend to prevent bacterial vaginosis from occurring.  You can also try vaginal probiotics to help return normal bacterial balance.   Take 1 Diflucan tablet now and repeat in 7 days after you have finished the metronidazole for your bacterial vaginosis.     ED Prescriptions   None    PDMP not reviewed this encounter.   Margarette Canada, NP 12/20/22 1745

## 2022-12-20 NOTE — Discharge Instructions (Addendum)
Take the Flagyl (metronidazole) 500 mg twice daily for treatment of your bacterial vaginosis.  Avoid alcohol while on the metronidazole as taken together will cause of vomiting.  Bacterial vaginosis is often caused by a imbalance of bacteria in your vaginal vault.  This is sometimes a result of using tampons or hormonal fluctuations during her menstrual cycle.  You if your symptoms are recurrent you can try using a boric acid suppository twice weekly to help maintain the acid-base balance in your vagina vault which could prevent further infection.  You can also use these after intercourse with your boyfriend to prevent bacterial vaginosis from occurring.  You can also try vaginal probiotics to help return normal bacterial balance.   Take 1 Diflucan tablet now and repeat in 7 days after you have finished the metronidazole for your bacterial vaginosis.

## 2022-12-22 ENCOUNTER — Telehealth: Payer: Self-pay | Admitting: Physician Assistant

## 2022-12-22 MED ORDER — METRONIDAZOLE 500 MG PO TABS: 500.0000 mg | ORAL_TABLET | Freq: Two times a day (BID) | ORAL | 0 refills | Status: AC

## 2022-12-22 MED ORDER — FLUCONAZOLE 150 MG PO TABS: 150.0000 mg | ORAL_TABLET | ORAL | 0 refills | Status: AC

## 2022-12-22 NOTE — Telephone Encounter (Signed)
Patient was seen on 12/20/2022.  Wet prep showed yeast and clue cells.  Provider note indicates intention to treat with metronidazole and Diflucan but medications were not sent to pharmacy.  Patient contacted [place] indicating that she did not have access to the medicine and requesting this be sent to pharmacy.  Prescription for metronidazole twice daily for 7 days and Diflucan 150 mg weekly for 2 doses sent to pharmacy on file.

## 2024-01-06 ENCOUNTER — Ambulatory Visit (INDEPENDENT_AMBULATORY_CARE_PROVIDER_SITE_OTHER): Payer: Medicaid Other

## 2024-01-06 DIAGNOSIS — R1033 Periumbilical pain: Secondary | ICD-10-CM

## 2024-01-06 DIAGNOSIS — R933 Abnormal findings on diagnostic imaging of other parts of digestive tract: Secondary | ICD-10-CM

## 2024-01-06 DIAGNOSIS — R198 Other specified symptoms and signs involving the digestive system and abdomen: Secondary | ICD-10-CM
